# Patient Record
Sex: Female | Born: 1975
Health system: Southern US, Community
[De-identification: ages and names within clinical notes are randomized; demographics above are authoritative.]

## PROBLEM LIST (undated history)

## (undated) DIAGNOSIS — I2699 Other pulmonary embolism without acute cor pulmonale: Secondary | ICD-10-CM

## (undated) DIAGNOSIS — K219 Gastro-esophageal reflux disease without esophagitis: Secondary | ICD-10-CM

## (undated) HISTORY — PX: ABDOMINAL HYSTERECTOMY: SHX81

---

## 1998-04-17 ENCOUNTER — Inpatient Hospital Stay (HOSPITAL_COMMUNITY): Admission: AD | Admit: 1998-04-17 | Discharge: 1998-04-17 | Payer: Self-pay | Admitting: Obstetrics & Gynecology

## 1998-04-18 ENCOUNTER — Inpatient Hospital Stay (HOSPITAL_COMMUNITY): Admission: AD | Admit: 1998-04-18 | Discharge: 1998-04-18 | Payer: Self-pay | Admitting: Obstetrics & Gynecology

## 1998-04-20 ENCOUNTER — Inpatient Hospital Stay (HOSPITAL_COMMUNITY): Admission: AD | Admit: 1998-04-20 | Discharge: 1998-04-20 | Payer: Self-pay | Admitting: Obstetrics & Gynecology

## 1998-04-24 ENCOUNTER — Inpatient Hospital Stay (HOSPITAL_COMMUNITY): Admission: AD | Admit: 1998-04-24 | Discharge: 1998-04-24 | Payer: Self-pay | Admitting: *Deleted

## 1998-04-24 ENCOUNTER — Encounter: Payer: Self-pay | Admitting: *Deleted

## 1998-05-01 ENCOUNTER — Inpatient Hospital Stay (HOSPITAL_COMMUNITY): Admission: AD | Admit: 1998-05-01 | Discharge: 1998-05-05 | Payer: Self-pay | Admitting: Obstetrics and Gynecology

## 1998-05-05 ENCOUNTER — Encounter (HOSPITAL_COMMUNITY): Admission: RE | Admit: 1998-05-05 | Discharge: 1998-08-03 | Payer: Self-pay | Admitting: Obstetrics and Gynecology

## 1999-01-01 ENCOUNTER — Other Ambulatory Visit: Admission: RE | Admit: 1999-01-01 | Discharge: 1999-01-01 | Payer: Self-pay | Admitting: Obstetrics and Gynecology

## 2000-06-01 ENCOUNTER — Other Ambulatory Visit: Admission: RE | Admit: 2000-06-01 | Discharge: 2000-06-01 | Payer: Self-pay | Admitting: Obstetrics and Gynecology

## 2002-05-03 ENCOUNTER — Other Ambulatory Visit: Admission: RE | Admit: 2002-05-03 | Discharge: 2002-05-03 | Payer: Self-pay | Admitting: Obstetrics and Gynecology

## 2003-07-04 ENCOUNTER — Other Ambulatory Visit: Admission: RE | Admit: 2003-07-04 | Discharge: 2003-07-04 | Payer: Self-pay | Admitting: Obstetrics and Gynecology

## 2005-11-30 ENCOUNTER — Encounter: Admission: RE | Admit: 2005-11-30 | Discharge: 2005-11-30 | Payer: Self-pay | Admitting: Family Medicine

## 2007-06-18 ENCOUNTER — Inpatient Hospital Stay (HOSPITAL_COMMUNITY): Admission: RE | Admit: 2007-06-18 | Discharge: 2007-06-20 | Payer: Self-pay | Admitting: Obstetrics and Gynecology

## 2007-06-18 ENCOUNTER — Encounter (INDEPENDENT_AMBULATORY_CARE_PROVIDER_SITE_OTHER): Payer: Self-pay | Admitting: Obstetrics and Gynecology

## 2007-06-19 ENCOUNTER — Encounter (INDEPENDENT_AMBULATORY_CARE_PROVIDER_SITE_OTHER): Payer: Self-pay | Admitting: Obstetrics and Gynecology

## 2007-06-19 ENCOUNTER — Ambulatory Visit: Payer: Self-pay | Admitting: Surgery

## 2010-08-17 NOTE — Op Note (Signed)
[name], [name]              ACCOUNT NO.:  [id number]   MEDICAL RECORD NO.:  [id number]          PATIENT TYPE:  AMB   LOCATION:  SDC                           FACILITY:  [facility]   PHYSICIAN:  [name], M.D. DATE OF BIRTH:  18-Jan-1976   DATE OF PROCEDURE:  06/18/2007  DATE OF DISCHARGE:                               OPERATIVE REPORT   PREOPERATIVE DIAGNOSES:  1. Bilateral adnexal masses.  2. Menorrhagia.  3. Dysmenorrhea.   POSTOPERATIVE DIAGNOSES:  1. Menorrhagia.  2. Dysmenorrhea.  3. Pelvic adhesions.  4. Findings consistent with endometriosis.   PROCEDURE:  1. Laparoscopy.  2. Lysis of adhesions.  3. Exploratory laparotomy.  4. Total abdominal hysterectomy.  5. Cystoscopy.   SURGEON:  Dr. Normand Sloop   ASSISTANT:  Dr. Su Hilt   ANESTHESIA:  General.   FINDINGS:  A 12-week size uterus, normal adnexa bilaterally, adnexal  uterine adhesions, sigmoid colon sidewall adhesions, but the adnexa were  both normal bilaterally, also normal bladder with bilateral efflux from  ureters seen.   DISPOSITION:  Uterus was to pathology.   ESTIMATED BLOOD LOSS:  600 mL.   URINE OUTPUT:  1050 mL.   IV FLUIDS:  4000 mL crystalloid.   COMPLICATIONS:  None.  The patient to PACU in stable condition.   PROCEDURE IN DETAIL:  The patient was taken to the operating room where  she was given general anesthesia, placed in dorsal lithotomy position,  prepped and draped in a normal fashion.  A Foley catheter was placed; 5  mL of Marcaine was placed under the umbilicus along the skin.  The skin  was grasped with Allis's and entered with the knife and carried down to  the fascia.  The fascia was incised in the midline and extended  bilaterally.  The Roseanne Reno was placed into the umbilical incision into the  peritoneum.  Intraabdominal placement was confirmed with the  laparoscope.  The abdomen was insufflated with CO2 gas.  Two 5 mm  trocars were placed on the right and left lower  quadrant s.  Before the  trocars were introduced the rumi eas placed.  A weighted speculum was  placed in the vagina.  The anterior lip of the cervix was grasped with a  single-tooth tenaculum.  The uterus did sound to 12 cm.  The size 10  probe was placed on the RUMI and was placed without difficulty.  Once  all of the trocars were placed, the adnexal adhesions from the adnexa  were freed away from the uterus, both sharply and bluntly using the  Harmonic, and the utero-ovarian ligaments were cauterized and cut using  the Harmonic, but both round ligaments were cauterized and cut using the  Harmonic, and the bladder flap was created.  There were some dense  adhesions along the bladder which were also taken down sharply using  Metzenbaum scissors.  At this time, the cervical vaginal fascia was then  entered with the Harmonic until we saw the anterior portion of the co-  ring.  The patient's right uterine artery was skeletonized, clamped, and  cauterized with the Harmonic; hemostasis  was assured.  The patient's  left uterine artery was clamped, cauterized, and cut with the Harmonic,  but it bled.  I tried to stop the bleeding with continuous use of the  Harmonic and Kleppingers, but it continued to bleed.  Because of the  bleeding that was coming from the uterine artery, I decided to do an  exploratory laparotomy.  All trocars were removed except for the [facility].  A Pfannenstiel skin incision was made along her previous incision with  the scalpel and carried down to the fascia.  The fascia was incised in  the midline and extended bilaterally.Kochers x2 placed in the superior  aspect of the fascia and was dissected off the rectus muscle both  sharply and bluntly.  The inferior aspect of the fascia was dissected in  a similar fashion.  The muscles were separated in the midline; the  peritoneum was identified, tented up, and entered sharply and extended  superiorly inferiorly with good  visualization of bowel and bladder.  The  bowel was packed away with moist laparotomy sponges.  A Balfour  retractor was placed.  A figure-of-eight suture was placed at the fundus  to lift up the uterus.  I was then able to clamp the uterine artery that  had been bleeding.  We estimated the blood loss to be 300 mL at this  point.  At this point, both cardinal ligaments were clamped, cut, and  suture ligated.  Both uterosacral ligaments were clamped, cut, and  suture ligated in Heaney.  The uterine fundus was then removed and, at  this point, we just put 2 curved Heaneys underneath the cervix and was  then able to remove the fundus to get good visualization of the cervix  to remove it.  The vaginal cuff was then closed with 0 Vicryl and figure-  of-eight stitches.  There was some bleeding from the posterior aspect of  the peritoneum along the vaginal cuff.  This was made hemostatic using  figure-of-eight stitches of 3-0 Vicryl.  Irrigation was done.  Hemostasis was assured.  We looked at both ovarian pedicles.  There was  a little bit of bleeding from the left ovarian pedicle which was made  hemostatic with Bovie cautery and Gelfoam.  Gelfoam was also placed  along the vaginal cuff.  All instruments were then removed from the  abdomen.  The peritoneum was closed with 0 chromic.  Before the  peritoneum was closed, we did check to make sure that the [facility] suture  was on fascia, and it turned out that it was not, so we removed that,  and we grabbed the fascia and was able to close the fascia at the  umbilical site.  Then the peritoneum was closed with 0 chromic in a  running fashion.  The muscles were irrigated and noted to be hemostatic,  and the fascia was closed with 0 Vicryl, going from right to left to  meet in the midline.  The subcu tissue was reapproximated using 2-0  plain.  The skin was closed using 3-0 Monocryl at the umbilicus and at  the Pfannenstiel skin incision, but Dermabond  was placed over all  incisions.  I then took the Foley catheter out, placed Betadine, and did  cystoscopy.  Bladder was seen to be normal in integrity, and no sutures  were seen.  And also, both ureters were seen to efflux.  Sponge, lap,  and needle counts were correct.  The patient went to recovery in stable  condition.  Also, when we looked inside the abdomen, the findings were  as this:  Both adnexa had adhesions but were normal, so they were left.  She had several adhesions, though, bilateral adnexal uterine adhesions,  pelvic sidewall adhesions from the sigmoid to the left adnexa to the  side wall.  The patient had bladder uterine adhesions from her previous  C-section.  She also had what looked like endometriosis with some  induration and chocolate cyst  formation along the right uterosacral ligament.  This was removed when  the uterus was removed.  The patient also had some mild scarring along  the top of the liver.  Once all instruments were removed, sponge, lap,  and needle counts were correct, the patient went to the recovery room in  stable condition.      Nicole A. Normand Sloop, M.D.  Electronically Signed     NAD/MEDQ  D:  06/18/2007  T:  06/18/2007  Job:  [id number]

## 2010-08-17 NOTE — H&P (Signed)
[name], [name]              ACCOUNT NO.:  [id number]   MEDICAL RECORD NO.:  [id number]          PATIENT TYPE:  AMB   LOCATION:  SDC                           FACILITY:  [facility]   PHYSICIAN:  [name] A. Dillard, M.D. DATE OF BIRTH:  04-28-1975   DATE OF ADMISSION:  DATE OF DISCHARGE:                              HISTORY & PHYSICAL   CHIEF COMPLAINT:  Bilateral adnexal masses and menorrhagia.   The patient is a 35 year old African-American female, gravida 1, para 1,  who presented to me in September 2008 complaining of heavy periods and  abdominal pain.  She had a CT scan through her primary care physician  for abdominal pain which revealed a large left septated cystic mass  measuring 5.9 x 8 x 5.7 cm and a small right adnexal mass measuring 2.8  x 2.9 cm . There was no fluid or adenopathy in the pelvis, and the  uterus appeared normal.  The patient then followed up with an ultrasound  which showed uterus to measure 13.4 x 6.8 x 5.8 with an endometrial  stripe of 5 mm . The right ovary was normal.  The left ovary still had  adnexal mass measuring 5.6 x 4.7 x 3.9.  The patient was then referred  to me.  She stated that she wanted a left oophorectomy and right  cystectomy with hysterectomy.  The patient stated she has painful  periods which last about 6 days, and 3 days she changes her tampon about  every 1-1/2 hours.  Also sometimes her periods will come twice a week.  The patient denies any vaginal discharge or odor.  She did have fever .  She denies dyspareunia, dysuria, frequency, urgency, or hematuria.  No  history of kidney stones.  She occasionally has constipation.  Denies  any diarrhea, rectal bleeding.  She occasionally has nausea and vomiting  with her periods.  The patient is not pregnant. Has no history of  fibroids or endometriosis.  Her ultrasound is as above.  She is anemic  with a hemoglobin of 10.8 and is taking iron pills.   PAST MEDICAL HISTORY:  As above.   PAST  OB HISTORY:  Significant for cesarean section x1 . She stated she  no longer wants any childbearing.   PAST SURGICAL HISTORY:  Significant for cesarean section x1.   MEDICATIONS:  None.   ALLERGIES:  No known drug allergies.   FAMILY HISTORY:  Significant for hypertension in her father.  No  diabetes or GYN cancer.   SOCIAL HISTORY:  Negative for tobacco, alcohol, and drug use.   REVIEW OF SYSTEMS:  GENITOURINARY:  Significant for menorrhagia and  bilateral adnexal masses.  PSYCHIATRY:  The patient has a history of  depression.  MUSCULOSKELETAL:  No weakness.  ENDOCRINE:  No thyroid  abnormalities.   PHYSICAL EXAMINATION:  VITAL SIGNS:  Blood pressure 106/80.  Weight 267  pounds.  Pulse 84, temperature 98.2.  HEENT:  Pupils are equal.  Hearing is normal.  Throat is clear.  NECK:  Thyroid is not enlarged.  HEART:  Regular rate and rhythm.  LUNGS:  Clear to auscultation bilaterally.  BACK:  There is no CVA tenderness.  ABDOMEN:  Nontender without mass or organomegaly.  EXTREMITIES:  No cyanosis, clubbing, or edema.  NEUROLOGIC:  Exam is within normal limits.  PELVIC:  Vaginal exam is within normal limits.  Cervix is nontender.  Uterus is normal size, shape, consistency, nontender.  I am unable to  palpate any adnexal masses on exam.   ASSESSMENT:  1. Bilateral ovarian cysts.  2. Menorrhagia.  3. Dysmenorrhea.  4. Pelvic pain.   PLAN:  Possible TVH, left oophorectomy, right cystectomy.  The patient  understands there could be a possible total abdominal hysterectomy  depending on how large the mass is.  All treatments were reviewed with  the patient including observation, repeat ultrasound, cystectomy,  oophorectomy, D&C, hormonal treatment, IUD, ablation, and hysterectomy.  She no longer wanted childbearing and desired the above procedure, the  patient was told there is a small risk that the ovary could be  cancerous.  If it is, she would need a second surgery.  She was  fine  with this.  She declined any GYN consults.  She did sign our  hysterectomy consent.  She understands the risks are not limited to  bleeding, infection, damage to internal organs, and problems with  anesthesia, problems with positioning.   PERTINENT LABORATORY DATA:  Her initial biopsy in March 2009 was  proliferative endometrium, negative hyperplasia, atypia, or malignancy.  CA-125 was normal at 12.5.  Pap smear in July 2008 was normal.      [name] A. Normand Sloop, M.D.  Electronically Signed     NAD/MEDQ  D:  06/17/2007  T:  06/17/2007  Job:  [id number]

## 2010-08-17 NOTE — Consult Note (Signed)
NAMEMONIFA, [name]              ACCOUNT NO.:  [id number]   MEDICAL RECORD NO.:  [id number]          PATIENT TYPE:  INP   LOCATION:  9302                          FACILITY:  [facility]   PHYSICIAN:  Michael L. Reynolds, M.D.DATE OF BIRTH:  1975/09/19   DATE OF CONSULTATION:  06/19/2007  DATE OF DISCHARGE:                                 CONSULTATION   REQUESTING PHYSICIAN:  Dr. Normand Sloop.   REASON FOR EVALUATION:  Right arm numbness and weakness.   HISTORY OF PRESENT ILLNESS:  This is the initial inpatient consultation  evaluation of this 35 year old woman with little past medical history.  She was admitted to [facility] yesterday following a gynecological  procedure which included laparotomy, hysterectomy, and cystectomy.  She  states that, since she woke up, she has noticed a heavy sensation in  the right upper extremity, says the right arm feels numb.  Says this  really has not changed at all since the surgery yesterday.  Is not  really a painful sensation, she denies any problems with her neck.  She  does report a little bit of tingling sensation.  She noticed that her  grip is not as strong as it was.  She reported this to her surgeon this  morning and neurologic consultation has subsequently been requested.  The patient denies any involvement of the face, any changes in vision or  speech, any difficulties with the right leg including numbness,  weakness, paresthesias.  She is ambulating normally and, otherwise,  feels fine.  She denies any history of neck problems or any similar  symptoms in the past.   PAST MEDICAL HISTORY:  No significant chronic medical problems as noted  above.  She had a C-section in the past.   FAMILY/SOCIAL/REVIEW OF SYSTEMS:  Per admission H&P of June 17, 2007  which is reviewed.   MEDICATIONS:  None chronically.  In the hospital, she is receiving  intravenous fluid and several p.r.n.'s  including Dilaudid.   PHYSICAL EXAMINATION:  VITAL SIGNS:   Temperature 98.2, blood pressure  105/65, pulse 110, respirations 20, O2 sat 98% on room air.  GENERAL EXAMINATION:  This is a healthy-appearing woman seen with no  evidence distress.  HEAD:  Cranium is normocephalic and atraumatic.  Oropharynx benign.  NECK:  Is supple without carotid or supraclavicular bruits.  HEART:  Regular rate and rhythm without murmurs.   NEUROLOGICAL EXAMINATION:  MENTAL STATUS:  She is awake and alert.  Speech is fluent and not dysarthric.  Mood is euthymic and affect  appropriate.  CRANIAL NERVES:  Pupils are equal and reactive.  Extraocular movements  are full without nystagmus.  Visual fields full to confrontation.  Hearing is intact to conversational speech.  Facial sensation intact to  pinprick.  Face, tongue and palate move normally and symmetrically.  MOTOR:  Normal bulk and tone.  In the right extremity, she has distinct  weakness of the biceps which is about 4-/5.  Other than that, she has  some motor impersistence in the right upper extremity, but actually is  able to generate full strength in all other tested  extremity muscles.  Strength is, otherwise, normal elsewhere.  Tone is normal.  SENSORY:  She reports diminished pinprick sensation over the lateral  forearm and the hand in the thumb and forefinger, to a lesser extent,  the middle finger.  Otherwise, normal pinprick and light touch sensation  throughout.  COORDINATION:  Rapid movements are performed slowly on the right and  adequately on the left.  Finger-to-nose is performed accurately,  although there is a bit of weakness and tremulousness on the right.  Gait is deferred.  REFLEXES:  No biceps or brachioradialis jerk on the right.  There is a  slight triceps jerk.  Reflexes in the left upper extremity are 1+.  Knee  jerks are 1+.  Toes are downgoing bilaterally.   LABORATORY REVIEW:  BMET for this morning remarkable for an elevated  glucose of 130, otherwise normal.  CBC remarkable for a  low hemoglobin  8.2, normal white count and platelets.  She has had no neuro imaging.   IMPRESSION:  Right extremity biceps weakness and sensory changes.  The  findings  suggest a peripheral process.  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  She also had a blood  pressure cuff on the right arm and that may have played a role.  Suspect  that localization is in the plexus, perhaps the lateral cord.  Right now  she is not having significant pain.   RECOMMENDATIONS:  Would suggest only conservative management and  observation at this time.  If the arm becomes painful, gabapentin 300 mg  up to t.i.d. can be used as needed.  Would anticipate that this will get  better in a couple of weeks but, if it does not, she should be seen in  our office for an outpatient EMG and nerve conduction study.  Hopefully,  this will resolve quickly.  Thank you for the consultation.      Michael L. Thad Ranger, M.D.  Electronically Signed     MLR/MEDQ  D:  06/19/2007  T:  06/19/2007  Job:  [id number]

## 2010-08-20 NOTE — Discharge Summary (Signed)
[name], [name]              ACCOUNT NO.:  [id number]   MEDICAL RECORD NO.:  [id number]          PATIENT TYPE:  INP   LOCATION:  9302                          FACILITY:  [facility]   PHYSICIAN:  [name] A. Dillard, M.D. DATE OF BIRTH:  Mar 27, 1976   DATE OF ADMISSION:  06/18/2007  DATE OF DISCHARGE:  06/20/2007                               DISCHARGE SUMMARY   DISCHARGE DIAGNOSES:  1. Menorrhagia.  2. Dysmenorrhea.  3. Bilateral adnexal masses.   OPERATION:  On the date of admission, the patient underwent laparoscopic  lysis of adhesions, exploratory laparotomy with a total abdominal  hysterectomy and cystoscopy, tolerating all procedures well.  The  patient was found to have a 12-week sized uterus with normal adnexa  bilaterally.  There were noted adnexal uterine adhesions, sigmoid colon  sidewall adhesions; however, both adnexa appeared normal, as was  bladder, and both ureters on cystoscopy were patent.   HISTORY OF PRESENT ILLNESS:  [name] is a 35 year old African-  American female para 1-0-0-1, who presented for hysterectomy because of  heavy menstrual periods and abdominal pain.  Please see the patient's  dictated history and physical examination for details.   PREOPERATIVE PHYSICAL EXAM:  VITAL SIGNS:  Blood pressure 106/80, weight  267 pounds, pulse 84, and temperature 98.2 degrees Fahrenheit orally.  GENERAL:  Within normal limits.  PELVIC:  Vagina is normal.  Cervix is nontender.  Uterus is normal size,  shape, and consistency.  Adnexa appeared normal without any palpable  evidence of adnexal masses.   HOSPITAL COURSE:  On the date of admission, the patient underwent  aforementioned procedures tolerating them all well.  The patient had a  postop hemoglobin of 9.6, which later dropped to 8.2; however, the  patient remained asymptomatic.  The patient's postoperative course was  marked by the patient complaining of pain in her left lower extremity;  however, venous  Doppler study did not reveal any deep pain nor  superficial vein thromboses or Baker's cyst.  Additionally, the patient  reported numbness in her right upper extremity and received a neurologic  consultation on postop day #1, at which time, it was deemed that she had  a peripheral nerve compression, which was expected to resolve over time.  By postop day #2, the patient had demonstrated improvement in her right  upper extremity numbness, no longer complained of left lower extremity  pain, had resumed bowel and bladder function, and was therefore deemed  ready for discharge home.   DISCHARGE MEDICATIONS:  1. Percocet 5/325 one to two tablets every 4 hours as needed for pain.  2. Ibuprofen 600 mg with food every 6 hours for 5 days, then as needed      for pain.  3. Repliva 1 tablet daily for 21 days, stop at 7 days, then resume      cycle x2.  4. Colace 100 mg twice daily until bowel movements are regular.  5. Gabapentin 300 mg 1 tablet 3 times a day as needed for right upper      extremity pain.   FOLLOW UP:  The patient is to  follow up with Dr. Normand Sloop on July 03, 2007 at 9:30 a.m.  She also is to schedule a 6-week postoperative visit  with her by calling [phone].   DISCHARGE INSTRUCTIONS:  The patient was given a copy of [facility] postoperative instruction sheet.  She was further  advised to avoid driving for 2 weeks, heavy lifting for 4 weeks,  intercourse for 6 weeks, but she may shower, and she may walk up steps.  The patient's diet was without restriction.   FINAL PATHOLOGY:  Uterus and cervix hysterectomy:  Benign secretory  endometrium, no hyperplasia or malignancy, chronic cervicitis with  squamous metaplasia, and no dysplasia or malignancy.      [name].      [name] A. Normand Sloop, M.D.  Electronically Signed    EJP/MEDQ  D:  07/09/2007  T:  07/09/2007  Job:  [id number]

## 2010-12-27 LAB — BASIC METABOLIC PANEL
BUN: 7
BUN: 3 — ABNORMAL LOW
CO2: 27
Calcium: 8.8
Calcium: 8.5
Chloride: 101
Chloride: 109
Creatinine, Ser: 0.89
Creatinine, Ser: 0.92
GFR calc Af Amer: >60
GFR calc non Af Amer: >60
Glucose, Bld: 112 — ABNORMAL HIGH
Potassium: 3.7
Sodium: 134 — ABNORMAL LOW

## 2010-12-27 LAB — CBC
HCT: 32.7 — ABNORMAL LOW
Hemoglobin: 10.9 — ABNORMAL LOW
MCHC: 33.2
MCV: 74.3 — ABNORMAL LOW
MCV: 75.4 — ABNORMAL LOW
Platelets: 268
Platelets: 189
RBC: 4.39
RDW: 16.2 — ABNORMAL HIGH
WBC: 7.3
WBC: 10.4

## 2010-12-27 LAB — URINE CULTURE: Culture: NO GROWTH

## 2010-12-27 LAB — HCG, QUANTITATIVE, PREGNANCY: hCG, Beta Chain, Quant, S: <2

## 2010-12-27 LAB — HEMOGLOBIN AND HEMATOCRIT, BLOOD
HCT: 30.3 — ABNORMAL LOW
Hemoglobin: 9.6 — ABNORMAL LOW

## 2011-06-03 DIAGNOSIS — E785 Hyperlipidemia, unspecified: Secondary | ICD-10-CM | POA: Insufficient documentation

## 2014-02-07 DIAGNOSIS — M549 Dorsalgia, unspecified: Secondary | ICD-10-CM | POA: Insufficient documentation

## 2014-03-03 ENCOUNTER — Other Ambulatory Visit: Payer: Self-pay

## 2014-03-03 DIAGNOSIS — Z1231 Encounter for screening mammogram for malignant neoplasm of breast: Secondary | ICD-10-CM

## 2014-03-04 DIAGNOSIS — E559 Vitamin D deficiency, unspecified: Secondary | ICD-10-CM | POA: Insufficient documentation

## 2014-03-17 ENCOUNTER — Ambulatory Visit
Admission: RE | Admit: 2014-03-17 | Discharge: 2014-03-17 | Disposition: A | Discharge status: Home / Self Care | Payer: 59 | Source: Ambulatory Visit

## 2014-03-17 DIAGNOSIS — Z1231 Encounter for screening mammogram for malignant neoplasm of breast: Secondary | ICD-10-CM

## 2014-11-11 DIAGNOSIS — L669 Cicatricial alopecia, unspecified: Secondary | ICD-10-CM | POA: Insufficient documentation

## 2014-12-24 DIAGNOSIS — Z5189 Encounter for other specified aftercare: Secondary | ICD-10-CM | POA: Insufficient documentation

## 2015-02-22 ENCOUNTER — Emergency Department (HOSPITAL_BASED_OUTPATIENT_CLINIC_OR_DEPARTMENT_OTHER): Payer: 59

## 2015-02-22 ENCOUNTER — Emergency Department (HOSPITAL_BASED_OUTPATIENT_CLINIC_OR_DEPARTMENT_OTHER)
Admission: EM | Admit: 2015-02-22 | Discharge: 2015-02-22 | Disposition: A | Discharge status: Home / Self Care | Payer: 59 | Attending: Emergency Medicine | Admitting: Emergency Medicine

## 2015-02-22 ENCOUNTER — Encounter (HOSPITAL_BASED_OUTPATIENT_CLINIC_OR_DEPARTMENT_OTHER): Payer: Self-pay | Admitting: Emergency Medicine

## 2015-02-22 DIAGNOSIS — R079 Chest pain, unspecified: Secondary | ICD-10-CM | POA: Insufficient documentation

## 2015-02-22 DIAGNOSIS — K219 Gastro-esophageal reflux disease without esophagitis: Secondary | ICD-10-CM | POA: Insufficient documentation

## 2015-02-22 DIAGNOSIS — R12 Heartburn: Secondary | ICD-10-CM | POA: Diagnosis present

## 2015-02-22 MED ORDER — GLUCAGON HCL RDNA (DIAGNOSTIC) 1 MG IJ SOLR
1.0000 mg | Freq: Once | INTRAMUSCULAR | Status: AC
  Administered 2015-02-22: 1 mg via INTRAMUSCULAR
  Filled 2015-02-22: qty 1

## 2015-02-22 MED ORDER — OMEPRAZOLE 20 MG PO CPDR: 20.0000 mg | DELAYED_RELEASE_CAPSULE | Freq: Every day | ORAL | Status: AC

## 2015-02-22 MED ORDER — GI COCKTAIL ~~LOC~~
30.0000 mL | Freq: Once | ORAL | Status: AC
  Administered 2015-02-22: 30 mL via ORAL
  Filled 2015-02-22: qty 30

## 2015-02-22 NOTE — Discharge Instructions (Signed)
Call [facility] to schedule a follow up appointment. Take the omeprazole daily.   Gastroesophageal Reflux Disease, Adult Normally, food travels down the esophagus and stays in the stomach to be digested. However, when a person has gastroesophageal reflux disease (GERD), food and stomach acid move back up into the esophagus. When this happens, the esophagus becomes sore and inflamed. Over time, GERD can create small holes (ulcers) in the lining of the esophagus.  CAUSES This condition is caused by a problem with the muscle between the esophagus and the stomach (lower esophageal sphincter, or LES). Normally, the LES muscle closes after food passes through the esophagus to the stomach. When the LES is weakened or abnormal, it does not close properly, and that allows food and stomach acid to go back up into the esophagus. The LES can be weakened by certain dietary substances, medicines, and medical conditions, including:  Tobacco use.  Pregnancy.  Having a hiatal hernia.  Heavy alcohol use.  Certain foods and beverages, such as coffee, chocolate, onions, and peppermint. RISK FACTORS This condition is more likely to develop in:  People who have an increased body weight.  People who have connective tissue disorders.  People who use NSAID medicines. SYMPTOMS Symptoms of this condition include:  Heartburn.  Difficult or painful swallowing.  The feeling of having a lump in the throat.  Abitter taste in the mouth.  Bad breath.  Having a large amount of saliva.  Having an upset or bloated stomach.  Belching.  Chest pain.  Shortness of breath or wheezing.  Ongoing (chronic) cough or a night-time cough.  Wearing away of tooth enamel.  Weight loss. Different conditions can cause chest pain. Make sure to see your health care provider if you experience chest pain. DIAGNOSIS Your health care provider will take a medical history and perform a physical exam. To  determine if you have mild or severe GERD, your health care provider may also monitor how you respond to treatment. You may also have other tests, including:  An endoscopy toexamine your stomach and esophagus with a small camera.  A test thatmeasures the acidity level in your esophagus.  A test thatmeasures how much pressure is on your esophagus.  A barium swallow or modified barium swallow to show the shape, size, and functioning of your esophagus. TREATMENT The goal of treatment is to help relieve your symptoms and to prevent complications. Treatment for this condition may vary depending on how severe your symptoms are. Your health care provider may recommend:  Changes to your diet.  Medicine.  Surgery. HOME CARE INSTRUCTIONS Diet  Follow a diet as recommended by your health care provider. This may involve avoiding foods and drinks such as:  Coffee and tea (with or without caffeine).  Drinks that containalcohol.  Energy drinks and sports drinks.  Carbonated drinks or sodas.  Chocolate and cocoa.  Peppermint and mint flavorings.  Garlic and onions.  Horseradish.  Spicy and acidic foods, including peppers, chili powder, curry powder, vinegar, hot sauces, and barbecue sauce.  Citrus fruit juices and citrus fruits, such as oranges, lemons, and limes.  Tomato-based foods, such as red sauce, chili, salsa, and pizza with red sauce.  Fried and fatty foods, such as donuts, french fries, potato chips, and high-fat dressings.  High-fat meats, such as hot dogs and fatty cuts of red and white meats, such as rib eye steak, sausage, ham, and bacon.  High-fat dairy items, such as whole milk, butter, and cream cheese.  Eat small,  frequent meals instead of large meals.  Avoid drinking large amounts of liquid with your meals.  Avoid eating meals during the 2-3 hours before bedtime.  Avoid lying down right after you eat.  Do not exercise right after you eat. General  Instructions  Pay attention to any changes in your symptoms.  Take over-the-counter and prescription medicines only as told by your health care provider. Do not take aspirin, ibuprofen, or other NSAIDs unless your health care provider told you to do so.  Do not use any tobacco products, including cigarettes, chewing tobacco, and e-cigarettes. If you need help quitting, ask your health care provider.  Wear loose-fitting clothing. Do not wear anything tight around your waist that causes pressure on your abdomen.  Raise (elevate) the head of your bed 6 inches (15cm).  Try to reduce your stress, such as with yoga or meditation. If you need help reducing stress, ask your health care provider.  If you are overweight, reduce your weight to an amount that is healthy for you. Ask your health care provider for guidance about a safe weight loss goal.  Keep all follow-up visits as told by your health care provider. This is important. SEEK MEDICAL CARE IF:  You have new symptoms.  You have unexplained weight loss.  You have difficulty swallowing, or it hurts to swallow.  You have wheezing or a persistent cough.  Your symptoms do not improve with treatment.  You have a hoarse voice. SEEK IMMEDIATE MEDICAL CARE IF:  You have pain in your arms, neck, jaw, teeth, or back.  You feel sweaty, dizzy, or light-headed.  You have chest pain or shortness of breath.  You vomit and your vomit looks like blood or coffee grounds.  You faint.  Your stool is bloody or black.  You cannot swallow, drink, or eat.   This information is not intended to replace advice given to you by your health care provider. Make sure you discuss any questions you have with your health care provider.   Document Released: 12/29/2004 Document Revised: 12/10/2014 Document Reviewed: 07/16/2014 Elsevier Interactive Patient Education [employer]

## 2015-02-22 NOTE — ED Notes (Signed)
Pt does report eating apple cider vinegar to help with indegestion, no there medicaitons were consumed

## 2015-02-22 NOTE — ED Provider Notes (Signed)
CSN: [id number]     Arrival date & time 02/22/15  1228 History   First MD Initiated Contact with Patient 02/22/15 1406     Chief Complaint  Patient presents with  . Heartburn   HPI  [name] is a 39 year old female with PMHx of reflux presenting with heartburn and sensation of "something stuck". She states that she has had GERD for almost a year now. She states that she typically controls her symptoms with over-the-counter acid relievers but she does not take these on a daily basis. She reports eating a large steak dinner approximately a week ago. She states she had this sensation of something stuck behind her sternum after this dinner. The feeling has gradually gotten better but is still there. She also reports increased heartburn over the past 3 weeks. She states that this is typical of her acid reflux symptoms. She has tried over-the-counter Prevacid but this has not helped. Her symptoms include sternal burning, increased belching and indigestion. She has no other complaints at this time. Denies fevers, chills, diaphoresis, headache, dizziness, shortness of breath, cough, abdominal pain, nausea or vomiting. She does not follow with a gastroenterologist.  History reviewed. No pertinent past medical history. History reviewed. No pertinent past surgical history. History reviewed. No pertinent family history. Social History  Substance Use Topics  . Smoking status: Never Smoker   . Smokeless tobacco: None  . Alcohol Use: No   OB History    No data available     Review of Systems  Constitutional: Negative for fever, chills and diaphoresis.  HENT: Negative for sore throat.   Respiratory: Negative for shortness of breath.   Cardiovascular: Positive for chest pain (burning).  Gastrointestinal: Negative for nausea, vomiting, abdominal pain and diarrhea.  Neurological: Negative for dizziness, syncope and headaches.  All other systems reviewed and are negative.     Allergies  Review of  patient's allergies indicates no known allergies.  Home Medications   Prior to Admission medications   Medication Sig Start Date End Date Taking? Authorizing Provider  omeprazole (PRILOSEC) 20 MG capsule Take 1 capsule (20 mg total) by mouth daily. 02/22/15   Myliah Medel, PA-C   BP 134/76 mmHg  Pulse 92  Temp(Src) 98 F (36.7 C) (Oral)  Resp 18  SpO2 100% Physical Exam  Constitutional: She appears well-developed and well-nourished. No distress.  HENT:  Head: Normocephalic and atraumatic.  Mouth/Throat: Oropharynx is clear and moist. No oropharyngeal exudate.  Eyes: Conjunctivae are normal. Right eye exhibits no discharge. Left eye exhibits no discharge. No scleral icterus.  Neck: Normal range of motion. Neck supple.  Cardiovascular: Normal rate, regular rhythm and normal heart sounds.   Pulmonary/Chest: Effort normal and breath sounds normal. No respiratory distress. She has no wheezes. She has no rales. She exhibits no tenderness.  Musculoskeletal: Normal range of motion.  Moves extremities spontaneously and walks with a steady gait  Lymphadenopathy:    She has no cervical adenopathy.  Neurological: She is alert. Coordination normal.  Skin: Skin is warm and dry.  Psychiatric: She has a normal mood and affect. Her behavior is normal.  Nursing note and vitals reviewed.   ED Course  Procedures (including critical care time) Labs Review Labs Reviewed - No data to display  Imaging Review Dg Chest 2 View  02/22/2015  CLINICAL DATA:  Midsternal chest pain and epigastric pain. EXAM: CHEST  2 VIEW COMPARISON:  None. FINDINGS: Cardiomediastinal silhouette is normal. Mediastinal contours appear intact. There is no  evidence of focal airspace consolidation, pleural effusion or pneumothorax. Osseous structures are without acute abnormality. Soft tissues are grossly normal. IMPRESSION: No active cardiopulmonary disease. Electronically Signed   By: Ted Mcalpineobrinka  Dimitrova M.D.   On:  02/22/2015 14:26   I have personally reviewed and evaluated these images and lab results as part of my medical decision-making.   EKG Interpretation   Date/Time:  Sunday February 22 2015 12:38:32 EST Ventricular Rate:  109 PR Interval:  146 QRS Duration: 68 QT Interval:  338 QTC Calculation: 455 R Axis:   62 Text Interpretation:  Sinus tachycardia Possible Left atrial enlargement  Borderline ECG No previous ECGs available Confirmed by ZACKOWSKI  MD,  SCOTT [phone]([id number]) on 02/22/2015 12:41:41 PM      MDM   Final diagnoses:  Gastroesophageal reflux disease, esophagitis presence not specified   Patient presenting with increased severity of acid reflux and sensation of something stuck in her chest. She states her acid reflux symptoms are typical of her presentation though they are not controlled with Prevacid. She states that she has sensation of food stuck behind her sternum yes take 1 week ago. She has no other complaints at this time. Vital signs stable. She was tachycardic in triage to 121 but during interview and exam heart rate remained in the 90s. Benign, nonfocal exam. She reports full symptom resolution with GI cocktail and glucagon. We'll give GI follow-up information and month-long omeprazole prescription.  At this time there does not appear to be any evidence of an acute emergency medical condition and the patient appears stable for discharge with appropriate outpatient follow up. Diagnosis was discussed with patient who verbalizes understanding and is agreeable to discharge. Return precautions given in discharge paperwork and discussed with pt at bedside. Pt is stable for discharge.   Rolm GalaStevi Shylin Keizer, PA-C 02/22/15 1847  Vanetta MuldersScott Zackowski, MD 02/23/15 1241

## 2015-02-22 NOTE — ED Notes (Signed)
Patient states that she is feeling much better and is ready to go home.

## 2015-02-22 NOTE — ED Notes (Signed)
Pt reports heartburn x 3 weeks, pain worse the last 3 days, today she felt sob, pt in nad during triage, talking without diffulty

## 2015-02-24 ENCOUNTER — Other Ambulatory Visit: Payer: Self-pay

## 2015-02-24 DIAGNOSIS — Z1231 Encounter for screening mammogram for malignant neoplasm of breast: Secondary | ICD-10-CM

## 2015-02-25 DIAGNOSIS — F329 Major depressive disorder, single episode, unspecified: Secondary | ICD-10-CM | POA: Insufficient documentation

## 2015-02-25 DIAGNOSIS — F411 Generalized anxiety disorder: Secondary | ICD-10-CM | POA: Diagnosis present

## 2015-03-18 DIAGNOSIS — R0789 Other chest pain: Secondary | ICD-10-CM | POA: Insufficient documentation

## 2015-03-19 ENCOUNTER — Ambulatory Visit
Admission: RE | Admit: 2015-03-19 | Discharge: 2015-03-19 | Disposition: A | Discharge status: Home / Self Care | Payer: 59 | Source: Ambulatory Visit

## 2015-03-19 DIAGNOSIS — Z1231 Encounter for screening mammogram for malignant neoplasm of breast: Secondary | ICD-10-CM

## 2015-03-24 ENCOUNTER — Ambulatory Visit: Payer: Self-pay

## 2015-06-21 ENCOUNTER — Encounter (HOSPITAL_BASED_OUTPATIENT_CLINIC_OR_DEPARTMENT_OTHER): Payer: Self-pay | Admitting: *Deleted

## 2015-06-21 ENCOUNTER — Emergency Department (HOSPITAL_BASED_OUTPATIENT_CLINIC_OR_DEPARTMENT_OTHER): Payer: 59

## 2015-06-21 DIAGNOSIS — Z90710 Acquired absence of both cervix and uterus: Secondary | ICD-10-CM

## 2015-06-21 DIAGNOSIS — K565 Intestinal adhesions [bands] with obstruction (postprocedural) (postinfection): Secondary | ICD-10-CM | POA: Diagnosis not present

## 2015-06-21 DIAGNOSIS — F411 Generalized anxiety disorder: Secondary | ICD-10-CM | POA: Diagnosis present

## 2015-06-21 DIAGNOSIS — R000 Tachycardia, unspecified: Secondary | ICD-10-CM | POA: Diagnosis present

## 2015-06-21 DIAGNOSIS — K567 Ileus, unspecified: Secondary | ICD-10-CM | POA: Diagnosis not present

## 2015-06-21 DIAGNOSIS — E669 Obesity, unspecified: Secondary | ICD-10-CM | POA: Diagnosis present

## 2015-06-21 DIAGNOSIS — R1033 Periumbilical pain: Secondary | ICD-10-CM | POA: Diagnosis not present

## 2015-06-21 DIAGNOSIS — K219 Gastro-esophageal reflux disease without esophagitis: Secondary | ICD-10-CM | POA: Diagnosis present

## 2015-06-21 DIAGNOSIS — Z6839 Body mass index (BMI) 39.0-39.9, adult: Secondary | ICD-10-CM

## 2015-06-21 LAB — URINE MICROSCOPIC-ADD ON

## 2015-06-21 LAB — URINALYSIS, ROUTINE W REFLEX MICROSCOPIC
GLUCOSE, UA: NEGATIVE mg/dL
KETONES UR: 15 mg/dL — AB
Nitrite: NEGATIVE
PH: 6 (ref 5.0–8.0)
Protein, ur: 100 mg/dL — AB
Specific Gravity, Urine: 1.027 (ref 1.005–1.030)

## 2015-06-21 NOTE — ED Notes (Addendum)
C/o abd pain, nv, last emesis yesterday. seen for the same last monday, dx'd with constipation, unable to tolerate miralax or the nausea med.  Seen again on Wednesday and given amitiza and zofran. Has taken MOM also. Last BM today, still liquid, has not had any solid foods. Here for remaining persistant abd pain. (denies: fever)

## 2015-06-22 ENCOUNTER — Inpatient Hospital Stay (HOSPITAL_COMMUNITY): Payer: 59

## 2015-06-22 ENCOUNTER — Emergency Department (HOSPITAL_BASED_OUTPATIENT_CLINIC_OR_DEPARTMENT_OTHER): Payer: 59

## 2015-06-22 ENCOUNTER — Encounter (HOSPITAL_BASED_OUTPATIENT_CLINIC_OR_DEPARTMENT_OTHER): Payer: Self-pay | Admitting: Emergency Medicine

## 2015-06-22 ENCOUNTER — Inpatient Hospital Stay (HOSPITAL_BASED_OUTPATIENT_CLINIC_OR_DEPARTMENT_OTHER)
Admission: EM | Admit: 2015-06-22 | Discharge: 2015-07-03 | DRG: 337 | Disposition: A | Discharge status: Home / Self Care | Payer: 59 | Attending: Surgery | Admitting: Surgery

## 2015-06-22 DIAGNOSIS — F411 Generalized anxiety disorder: Secondary | ICD-10-CM | POA: Diagnosis present

## 2015-06-22 DIAGNOSIS — R000 Tachycardia, unspecified: Secondary | ICD-10-CM | POA: Diagnosis present

## 2015-06-22 DIAGNOSIS — E669 Obesity, unspecified: Secondary | ICD-10-CM | POA: Diagnosis not present

## 2015-06-22 DIAGNOSIS — Z6839 Body mass index (BMI) 39.0-39.9, adult: Secondary | ICD-10-CM | POA: Diagnosis not present

## 2015-06-22 DIAGNOSIS — K56609 Unspecified intestinal obstruction, unspecified as to partial versus complete obstruction: Secondary | ICD-10-CM | POA: Diagnosis present

## 2015-06-22 DIAGNOSIS — K219 Gastro-esophageal reflux disease without esophagitis: Secondary | ICD-10-CM | POA: Diagnosis not present

## 2015-06-22 DIAGNOSIS — Z90710 Acquired absence of both cervix and uterus: Secondary | ICD-10-CM | POA: Diagnosis not present

## 2015-06-22 DIAGNOSIS — Z0189 Encounter for other specified special examinations: Secondary | ICD-10-CM

## 2015-06-22 DIAGNOSIS — K565 Intestinal adhesions [bands] with obstruction (postprocedural) (postinfection): Secondary | ICD-10-CM | POA: Diagnosis not present

## 2015-06-22 DIAGNOSIS — K567 Ileus, unspecified: Secondary | ICD-10-CM | POA: Diagnosis not present

## 2015-06-22 DIAGNOSIS — R1033 Periumbilical pain: Secondary | ICD-10-CM | POA: Diagnosis present

## 2015-06-22 HISTORY — DX: Gastro-esophageal reflux disease without esophagitis: K21.9

## 2015-06-22 LAB — CBC WITH DIFFERENTIAL/PLATELET
BASOS ABS: 0 10*3/uL (ref 0.0–0.1)
BLASTS: 0 %
Band Neutrophils: 12 %
Basophils Relative: 0 %
Eosinophils Absolute: 0 10*3/uL (ref 0.0–0.7)
Eosinophils Relative: 0 %
HEMATOCRIT: 42.8 % (ref 36.0–46.0)
Hemoglobin: 14 g/dL (ref 12.0–15.0)
Lymphocytes Relative: 23 %
Lymphs Abs: 1.8 10*3/uL (ref 0.7–4.0)
MCH: 26.6 pg (ref 26.0–34.0)
MCHC: 32.7 g/dL (ref 30.0–36.0)
MCV: 81.2 fL (ref 78.0–100.0)
METAMYELOCYTES PCT: 0 %
MYELOCYTES: 0 %
Monocytes Absolute: 1.6 10*3/uL — ABNORMAL HIGH (ref 0.1–1.0)
Monocytes Relative: 20 %
NEUTROS ABS: 4.4 10*3/uL (ref 1.7–7.7)
NRBC: 0 /100{WBCs}
Neutrophils Relative %: 45 %
Other: 0 %
Platelets: 282 10*3/uL (ref 150–400)
Promyelocytes Absolute: 0 %
RBC: 5.27 MIL/uL — AB (ref 3.87–5.11)
RDW: 13.3 % (ref 11.5–15.5)
WBC: 7.8 10*3/uL (ref 4.0–10.5)

## 2015-06-22 LAB — BASIC METABOLIC PANEL
ANION GAP: 10 (ref 5–15)
BUN: 16 mg/dL (ref 6–20)
CO2: 29 mmol/L (ref 22–32)
Calcium: 9 mg/dL (ref 8.9–10.3)
Chloride: 95 mmol/L — ABNORMAL LOW (ref 101–111)
Creatinine, Ser: 1.1 mg/dL — ABNORMAL HIGH (ref 0.44–1.00)
GFR calc Af Amer: >60 mL/min (ref >60)
GFR calc non Af Amer: >60 mL/min (ref >60)
GLUCOSE: 109 mg/dL — AB (ref 65–99)
POTASSIUM: 3.2 mmol/L — AB (ref 3.5–5.1)
Sodium: 134 mmol/L — ABNORMAL LOW (ref 135–145)

## 2015-06-22 MED ORDER — IOHEXOL 300 MG/ML  SOLN
25.0000 mL | Freq: Once | INTRAMUSCULAR | Status: AC | PRN
  Administered 2015-06-22: 25 mL via ORAL

## 2015-06-22 MED ORDER — KETOROLAC TROMETHAMINE 30 MG/ML IJ SOLN
30.0000 mg | Freq: Once | INTRAMUSCULAR | Status: AC
  Administered 2015-06-22: 30 mg via INTRAVENOUS
  Filled 2015-06-22: qty 1

## 2015-06-22 MED ORDER — CHLORHEXIDINE GLUCONATE 0.12 % MT SOLN
15.0000 mL | Freq: Two times a day (BID) | OROMUCOSAL | Status: DC
Start: 2015-06-22 — End: 2015-06-26
  Administered 2015-06-22 – 2015-06-25 (×7): 15 mL via OROMUCOSAL
  Filled 2015-06-22 (×10): qty 15

## 2015-06-22 MED ORDER — ONDANSETRON 4 MG PO TBDP: 4.0000 mg | ORAL_TABLET | Freq: Four times a day (QID) | ORAL | Status: DC | PRN

## 2015-06-22 MED ORDER — SODIUM CHLORIDE 0.9 % IV BOLUS (SEPSIS)
1000.0000 mL | Freq: Once | INTRAVENOUS | Status: AC
  Administered 2015-06-22: 1000 mL via INTRAVENOUS

## 2015-06-22 MED ORDER — FENTANYL CITRATE (PF) 100 MCG/2ML IJ SOLN
100.0000 ug | Freq: Once | INTRAMUSCULAR | Status: AC
  Administered 2015-06-22: 100 ug via INTRAVENOUS
  Filled 2015-06-22: qty 2

## 2015-06-22 MED ORDER — ENOXAPARIN SODIUM 40 MG/0.4ML ~~LOC~~ SOLN
40.0000 mg | SUBCUTANEOUS | Status: DC
  Administered 2015-06-22 – 2015-06-26 (×5): 40 mg via SUBCUTANEOUS
  Filled 2015-06-22 (×5): qty 0.4

## 2015-06-22 MED ORDER — KCL IN DEXTROSE-NACL 20-5-0.9 MEQ/L-%-% IV SOLN
INTRAVENOUS | Status: DC
  Administered 2015-06-22 – 2015-06-23 (×3): 1000 mL via INTRAVENOUS
  Administered 2015-06-23: 13:00:00 via INTRAVENOUS
  Filled 2015-06-22 (×5): qty 1000

## 2015-06-22 MED ORDER — ONDANSETRON HCL 4 MG/2ML IJ SOLN
4.0000 mg | Freq: Once | INTRAMUSCULAR | Status: AC
  Administered 2015-06-22: 4 mg via INTRAVENOUS
  Filled 2015-06-22: qty 2

## 2015-06-22 MED ORDER — MORPHINE SULFATE (PF) 2 MG/ML IV SOLN
2.0000 mg | INTRAVENOUS | Status: DC | PRN
  Administered 2015-06-22 – 2015-06-25 (×9): 4 mg via INTRAVENOUS
  Administered 2015-06-25: 2 mg via INTRAVENOUS
  Administered 2015-06-25 – 2015-06-26 (×5): 4 mg via INTRAVENOUS
  Filled 2015-06-22 (×15): qty 2
  Filled 2015-06-22: qty 1

## 2015-06-22 MED ORDER — IOHEXOL 300 MG/ML  SOLN
100.0000 mL | Freq: Once | INTRAMUSCULAR | Status: AC | PRN
  Administered 2015-06-22: 100 mL via INTRAVENOUS

## 2015-06-22 MED ORDER — DIATRIZOATE MEGLUMINE & SODIUM 66-10 % PO SOLN
90.0000 mL | Freq: Once | ORAL | Status: AC
  Administered 2015-06-22: 90 mL via NASOGASTRIC
  Filled 2015-06-22: qty 90

## 2015-06-22 MED ORDER — CETYLPYRIDINIUM CHLORIDE 0.05 % MT LIQD
7.0000 mL | Freq: Two times a day (BID) | OROMUCOSAL | Status: DC
  Administered 2015-06-22 – 2015-06-25 (×6): 7 mL via OROMUCOSAL

## 2015-06-22 MED ORDER — MORPHINE SULFATE (PF) 4 MG/ML IV SOLN
4.0000 mg | INTRAVENOUS | Status: AC
  Administered 2015-06-22: 4 mg via INTRAVENOUS
  Filled 2015-06-22: qty 1

## 2015-06-22 MED ORDER — PHENOL 1.4 % MT LIQD
1.0000 | OROMUCOSAL | Status: DC | PRN
  Filled 2015-06-22: qty 177

## 2015-06-22 MED ORDER — PROMETHAZINE HCL 25 MG/ML IJ SOLN
INTRAMUSCULAR | Status: AC
  Administered 2015-06-22: 25 mg via INTRAVENOUS
  Filled 2015-06-22: qty 1

## 2015-06-22 MED ORDER — ONDANSETRON HCL 4 MG/2ML IJ SOLN
4.0000 mg | Freq: Four times a day (QID) | INTRAMUSCULAR | Status: DC | PRN
  Administered 2015-06-26: 4 mg via INTRAVENOUS

## 2015-06-22 MED ORDER — PANTOPRAZOLE SODIUM 40 MG IV SOLR
40.0000 mg | Freq: Every day | INTRAVENOUS | Status: DC
  Administered 2015-06-22 – 2015-06-25 (×4): 40 mg via INTRAVENOUS
  Filled 2015-06-22 (×5): qty 40

## 2015-06-22 MED ORDER — PROMETHAZINE HCL 25 MG/ML IJ SOLN
25.0000 mg | Freq: Once | INTRAMUSCULAR | Status: AC
  Administered 2015-06-22: 25 mg via INTRAVENOUS

## 2015-06-22 NOTE — ED Notes (Signed)
Bed: [id number] Expected date:  Expected time:  Means of arrival:  Comments: Transfer from med center

## 2015-06-22 NOTE — ED Provider Notes (Signed)
Patient sent from [facility] for surgical consultation of small bowel obstruction. An NG tube is in place and connected to low intermittent suction. Dr. Johna SheriffHoxworth will be notified of the patient's presence in the emergency department so that he may evaluate her here. She appears stable.  Nicole Lyonsouglas Fionna Merriott, MD 06/22/15 [phone]

## 2015-06-22 NOTE — ED Provider Notes (Signed)
CSN: [id number]     Arrival date & time 06/21/15  2102 History  By signing my name below, I, [name], attest that this documentation has been prepared under the direction and in the presence of Elsy Chiang, MD. Electronically Signed: Budd PalmerVanessa Short, ED Scribe. 06/22/2015. 1:23 AM.     Chief Complaint  Patient presents with  . Abdominal Pain   Patient is a 40 y.o. female presenting with abdominal pain. The history is provided by the patient. No language interpreter was used.  Abdominal Pain Pain location:  Generalized Pain quality: cramping   Pain radiates to:  Does not radiate Pain severity:  Mild Onset quality:  Gradual Duration:  1 week Timing:  Constant Progression:  Unchanged Chronicity:  Recurrent Context: laxative use and retching   Relieved by:  Nothing Worsened by:  Nothing tried Ineffective treatments:  Bowel activity, liquids, OTC medications and vomiting Associated symptoms: nausea and vomiting   Associated symptoms: no fever   Nausea:    Severity:  Mild   Onset quality:  Gradual   Duration:  1 week   Timing:  Constant   Progression:  Unchanged Vomiting:    Severity:  Mild   Duration:  1 week   Timing:  Sporadic   Progression:  Unchanged Risk factors: no alcohol abuse, has not had multiple surgeries and not pregnant    HPI Comments: [name] is a 40 y.o. female with a PMHx of acid reflux who presents to the Emergency Department complaining of constant, cramping, generalized abdominal pain onset 1 week ago. She reports associated nausea and vomiting. Pt states she was seen by her PCP at onset and diagnosed with constipation, when she was prescribed miralax and a nausea medication. She was then seen again 5 days ago for the same, because she could not tolerate the nausea medication, and given amitiza and zofran. She notes that sicne then she has had sevral BM's, but that the abdominal cramping has not yet resolved, which is why she came to the ED  today. She states she has taken zofran in the past with effective relief.   Past Medical History  Diagnosis Date  . Acid reflux    Past Surgical History  Procedure Laterality Date  . Abdominal hysterectomy     History reviewed. No pertinent family history. Social History  Substance Use Topics  . Smoking status: Never Smoker   . Smokeless tobacco: None  . Alcohol Use: No   OB History    No data available     Review of Systems  Constitutional: Negative for fever.  Gastrointestinal: Positive for nausea, vomiting and abdominal pain.  All other systems reviewed and are negative.   Allergies  Review of patient's allergies indicates no known allergies.  Home Medications   Prior to Admission medications   Medication Sig Start Date End Date Taking? Authorizing Provider  Lubiprostone (AMITIZA PO) Take by mouth.   Yes Historical Provider, MD  Ondansetron (ZOFRAN ODT PO) Take by mouth.   Yes Historical Provider, MD  PHENTERMINE HCL PO Take by mouth.   Yes Historical Provider, MD  omeprazole (PRILOSEC) 20 MG capsule Take 1 capsule (20 mg total) by mouth daily. 02/22/15   Stevi Barrett, PA-C   BP 124/78 mmHg  Pulse 110  Temp(Src) 98.3 F (36.8 C) (Oral)  Resp 18  Ht 5\' 10"  (1.778 m)  Wt 274 lb (124.286 kg)  BMI 39.32 kg/m2  SpO2 99% Physical Exam  Constitutional: She is oriented to person, place,  and time. She appears well-developed and well-nourished.  HENT:  Head: Normocephalic and atraumatic.  Eyes: Conjunctivae are normal. Pupils are equal, round, and reactive to light. Right eye exhibits no discharge. Left eye exhibits no discharge.  Neck: Normal range of motion. Neck supple.  Pulmonary/Chest: Effort normal. No respiratory distress. She has no wheezes. She has no rales.  Abdominal: Soft. Bowel sounds are increased. There is no tenderness. There is no rebound, no guarding, no tenderness at McBurney's point and negative Murphy's sign.  Neurological: She is alert and  oriented to person, place, and time. Coordination normal.  Skin: Skin is warm and dry. No rash noted. She is not diaphoretic. No erythema.  Psychiatric: She has a normal mood and affect.  Nursing note and vitals reviewed.   ED Course  Procedures  DIAGNOSTIC STUDIES: Oxygen Saturation is 99% on RA, normal by my interpretation.    COORDINATION OF CARE: 12:55 AM - Discussed plans to . Pt advised of plan for treatment and pt agrees.  Labs Review Labs Reviewed  URINALYSIS, ROUTINE W REFLEX MICROSCOPIC (NOT AT [facility]) - Abnormal; Notable for the following:    Color, Urine ORANGE (*)    APPearance CLOUDY (*)    Hgb urine dipstick MODERATE (*)    Bilirubin Urine SMALL (*)    Ketones, ur 15 (*)    Protein, ur 100 (*)    Leukocytes, UA TRACE (*)    All other components within normal limits  URINE MICROSCOPIC-ADD ON - Abnormal; Notable for the following:    Squamous Epithelial / LPF 6-30 (*)    Bacteria, UA MANY (*)    Casts GRANULAR CAST (*)    All other components within normal limits    Imaging Review Dg Abd Acute W/chest  06/22/2015  CLINICAL DATA:  Abdominal pain, nausea and vomiting yesterday. Seen for the same symptoms last Monday. CT again on Wednesday. Persistent abdominal pain. EXAM: DG ABDOMEN ACUTE W/ 1V CHEST COMPARISON:  Chest 02/22/2015 FINDINGS: Normal heart size and pulmonary vascularity. No focal airspace disease or consolidation in the lungs. No blunting of costophrenic angles. No pneumothorax. Mediastinal contours appear intact. Multiple gas-filled mildly distended left upper quadrant and mid abdominal small bowel loops with multiple air-fluid levels. Appearance is consistent with small bowel obstruction. No free intra-abdominal air. Paucity of gas in the colon. No radiopaque stones. Visualized bones appear intact. IMPRESSION: Gas distended small bowel with air-fluid levels consistent with small bowel obstruction. Electronically Signed   By: Burman Nieves M.D.   On:  06/22/2015 01:11   I have personally reviewed and evaluated these images and lab results as part of my medical decision-making.   EKG Interpretation None      MDM   Final diagnoses:  None   Results for orders placed or performed during the hospital encounter of 06/22/15  Urinalysis, Routine w reflex microscopic (not at [facility])  Result Value Ref Range   Color, Urine ORANGE (A) YELLOW   APPearance CLOUDY (A) CLEAR   Specific Gravity, Urine 1.027 1.005 - 1.030   pH 6.0 5.0 - 8.0   Glucose, UA NEGATIVE NEGATIVE mg/dL   Hgb urine dipstick MODERATE (A) NEGATIVE   Bilirubin Urine SMALL (A) NEGATIVE   Ketones, ur 15 (A) NEGATIVE mg/dL   Protein, ur 161 (A) NEGATIVE mg/dL   Nitrite NEGATIVE NEGATIVE   Leukocytes, UA TRACE (A) NEGATIVE  Urine microscopic-add on  Result Value Ref Range   Squamous Epithelial / LPF 6-30 (A) NONE SEEN   WBC,  UA 0-5 0 - 5 WBC/hpf   RBC / HPF 6-30 0 - 5 RBC/hpf   Bacteria, UA MANY (A) NONE SEEN   Casts GRANULAR CAST (A) NEGATIVE   Urine-Other MUCOUS PRESENT   CBC with Differential/Platelet  Result Value Ref Range   WBC 7.8 4.0 - 10.5 K/uL   RBC 5.27 (H) 3.87 - 5.11 MIL/uL   Hemoglobin 14.0 12.0 - 15.0 g/dL   HCT 16.1 09.6 - 04.5 %   MCV 81.2 78.0 - 100.0 fL   MCH 26.6 26.0 - 34.0 pg   MCHC 32.7 30.0 - 36.0 g/dL   RDW 40.9 81.1 - 91.4 %   Platelets 282 150 - 400 K/uL   Neutrophils Relative % 45 %   Lymphocytes Relative 23 %   Monocytes Relative 20 %   Eosinophils Relative 0 %   Basophils Relative 0 %   Band Neutrophils 12 %   Metamyelocytes Relative 0 %   Myelocytes 0 %   Promyelocytes Absolute 0 %   Blasts 0 %   nRBC 0 0 /100 WBC   Other 0 %   Neutro Abs 4.4 1.7 - 7.7 K/uL   Lymphs Abs 1.8 0.7 - 4.0 K/uL   Monocytes Absolute 1.6 (H) 0.1 - 1.0 K/uL   Eosinophils Absolute 0.0 0.0 - 0.7 K/uL   Basophils Absolute 0.0 0.0 - 0.1 K/uL   WBC Morphology TOXIC GRANULATION   Basic metabolic panel  Result Value Ref Range   Sodium 134 (L) 135 -  145 mmol/L   Potassium 3.2 (L) 3.5 - 5.1 mmol/L   Chloride 95 (L) 101 - 111 mmol/L   CO2 29 22 - 32 mmol/L   Glucose, Bld 109 (H) 65 - 99 mg/dL   BUN 16 6 - 20 mg/dL   Creatinine, Ser 7.82 (H) 0.44 - 1.00 mg/dL   Calcium 9.0 8.9 - 95.6 mg/dL   GFR calc non Af Amer >60 >60 mL/min   GFR calc Af Amer >60 >60 mL/min   Anion gap 10 5 - 15   Ct Abdomen Pelvis W Contrast  06/22/2015  CLINICAL DATA:  Constant cramping generalized abdominal pain for a week. Nausea and vomiting. EXAM: CT ABDOMEN AND PELVIS WITH CONTRAST TECHNIQUE: Multidetector CT imaging of the abdomen and pelvis was performed using the standard protocol following bolus administration of intravenous contrast. CONTRAST:  25mL OMNIPAQUE IOHEXOL 300 MG/ML SOLN, OMNIPAQUE IOHEXOL 300 MG/ML SOLN COMPARISON:  11/21/2006 FINDINGS: Dependent atelectasis in the lung bases. The liver, spleen, gallbladder, pancreas, adrenal glands, kidneys, abdominal aorta, inferior vena cava, and retroperitoneal lymph nodes are unremarkable. Proximal small bowel are distended with gas and fluid and multiple air-fluid levels are present. There is mild edema in the mesenteric. No small bowel wall thickening. Distal small bowel are decompressed. Appearance is consistent with small bowel obstruction. Transition zone appears to be in the right lower quadrant although is not specifically identified. Cause is not identified. No free air in the abdomen. Pelvis: Bladder wall is not thickened. Uterus is surgically absent. No pelvic mass or lymphadenopathy. No free or loculated pelvic fluid collections. The appendix is normal. No evidence of vasculitis. No destructive bone lesions. IMPRESSION: Distended small bowel with air-fluid levels consistent with small bowel obstruction. Transition zone is not specifically identified but appears to be within the right lower quadrant. Distal ileum are decompressed. Small amount of mesenteric edema. No small bowel wall thickening.  Electronically Signed   By: Burman Nieves M.D.   On: 06/22/2015 03:11  Dg Abd Acute W/chest  06/22/2015  CLINICAL DATA:  Abdominal pain, nausea and vomiting yesterday. Seen for the same symptoms last Monday. CT again on Wednesday. Persistent abdominal pain. EXAM: DG ABDOMEN ACUTE W/ 1V CHEST COMPARISON:  Chest 02/22/2015 FINDINGS: Normal heart size and pulmonary vascularity. No focal airspace disease or consolidation in the lungs. No blunting of costophrenic angles. No pneumothorax. Mediastinal contours appear intact. Multiple gas-filled mildly distended left upper quadrant and mid abdominal small bowel loops with multiple air-fluid levels. Appearance is consistent with small bowel obstruction. No free intra-abdominal air. Paucity of gas in the colon. No radiopaque stones. Visualized bones appear intact. IMPRESSION: Gas distended small bowel with air-fluid levels consistent with small bowel obstruction. Electronically Signed   By: Burman Nieves M.D.   On: 06/22/2015 01:11   ' Medications  fentaNYL (SUBLIMAZE) injection 100 mcg (not administered)  sodium chloride 0.9 % bolus 1,000 mL (not administered)  promethazine (PHENERGAN) injection 25 mg (not administered)  ondansetron (ZOFRAN) injection 4 mg (4 mg Intravenous Given 06/22/15 0206)  sodium chloride 0.9 % bolus 1,000 mL (1,000 mLs Intravenous New Bag/Given 06/22/15 0207)  ketorolac (TORADOL) 30 MG/ML injection 30 mg (30 mg Intravenous Given 06/22/15 0206)  iohexol (OMNIPAQUE) 300 MG/ML solution 25 mL (25 mLs Oral Contrast Given 06/22/15 0145)  iohexol (OMNIPAQUE) 300 MG/ML solution 100 mL (100 mLs Intravenous Contrast Given 06/22/15 0246)   Case d/w Dr. Johna Sheriff to the ED at [facility] to be seen by [facility] Case d/w Dr. Judd Lien who is aware of patient  Case d/w Misty Stanley charge at [facility]  I personally performed the services described in this documentation, which was scribed in my presence. The recorded information has been reviewed and is accurate.     Cy Blamer, MD 06/22/15 [id number]

## 2015-06-22 NOTE — H&P (Signed)
[name] is an 40 y.o. female.    Chief Complaint: Abdominal pain, vomiting  HPI: Patient is a 40 year old female who 6 days ago developed the onset of sharp periumbilical abdominal pain.  She describes the pain as constant and moderately severe. Onset was gradual.  Soon after the onset of the pain she began to have frequent nausea and vomiting. Unable to tolerate much of anything by mouth. She also did not have any bowel movements. She saw her primary physician who thought she was constipated and she was treated with laxatives. This caused some increased vomiting but she did begin to have bowel movements and describes normal bowel movements over the last few days. However the pain has continued  As has been vomiting. She feels a little distended. She presented to the emergency department last night. No history of any previous similar episodes. Her only surgeries have been a hysterectomy and C-section done through a Pfannenstiel incision. No urinary symptoms. No fever or chills.  Past Medical History  Diagnosis Date  . Acid reflux     Past Surgical History  Procedure Laterality Date  . Abdominal hysterectomy      History reviewed. No pertinent family history. Social History:  reports that she has never smoked. She does not have any smokeless tobacco history on file. She reports that she does not drink alcohol or use illicit drugs.  Allergies: No Known Allergies   Current Facility-Administered Medications  Medication Dose Route Frequency Provider Last Rate Last Dose  . morphine 4 MG/ML injection 4 mg  4 mg Intravenous NOW Excell Seltzer, MD       Current Outpatient Prescriptions  Medication Sig Dispense Refill  . lubiprostone (AMITIZA) 24 MCG capsule Take 24 mcg by mouth 2 (two) times daily.    . Multiple Vitamin (MULTIVITAMIN WITH MINERALS) TABS tablet Take 1 tablet by mouth daily.    Marland Kitchen omeprazole (PRILOSEC) 20 MG capsule Take 1 capsule (20 mg total) by mouth daily. 30  capsule 0  . ondansetron (ZOFRAN-ODT) 4 MG disintegrating tablet Take 4 mg by mouth every 4 (four) hours as needed. For nausea  0  . phentermine (ADIPEX-P) 37.5 MG tablet Take 37.5 mg by mouth daily.  1     Results for orders placed or performed during the hospital encounter of 06/22/15 (from the past 48 hour(s))  Urinalysis, Routine w reflex microscopic (not at [facility])     Status: Abnormal   Collection Time: 06/21/15 10:09 PM  Result Value Ref Range   Color, Urine ORANGE (A) YELLOW    Comment: BIOCHEMICALS MAY BE AFFECTED BY COLOR   APPearance CLOUDY (A) CLEAR   Specific Gravity, Urine 1.027 1.005 - 1.030   pH 6.0 5.0 - 8.0   Glucose, UA NEGATIVE NEGATIVE mg/dL   Hgb urine dipstick MODERATE (A) NEGATIVE   Bilirubin Urine SMALL (A) NEGATIVE   Ketones, ur 15 (A) NEGATIVE mg/dL   Protein, ur 100 (A) NEGATIVE mg/dL   Nitrite NEGATIVE NEGATIVE   Leukocytes, UA TRACE (A) NEGATIVE  Urine microscopic-add on     Status: Abnormal   Collection Time: 06/21/15 10:09 PM  Result Value Ref Range   Squamous Epithelial / LPF 6-30 (A) NONE SEEN   WBC, UA 0-5 0 - 5 WBC/hpf   RBC / HPF 6-30 0 - 5 RBC/hpf   Bacteria, UA MANY (A) NONE SEEN   Casts GRANULAR CAST (A) NEGATIVE   Urine-Other MUCOUS PRESENT   CBC with Differential/Platelet     Status: Abnormal  Collection Time: 06/22/15  1:50 AM  Result Value Ref Range   WBC 7.8 4.0 - 10.5 K/uL   RBC 5.27 (H) 3.87 - 5.11 MIL/uL   Hemoglobin 14.0 12.0 - 15.0 g/dL   HCT 42.8 36.0 - 46.0 %   MCV 81.2 78.0 - 100.0 fL   MCH 26.6 26.0 - 34.0 pg   MCHC 32.7 30.0 - 36.0 g/dL   RDW 13.3 11.5 - 15.5 %   Platelets 282 150 - 400 K/uL   Neutrophils Relative % 45 %   Lymphocytes Relative 23 %   Monocytes Relative 20 %   Eosinophils Relative 0 %   Basophils Relative 0 %   Band Neutrophils 12 %   Metamyelocytes Relative 0 %   Myelocytes 0 %   Promyelocytes Absolute 0 %   Blasts 0 %   nRBC 0 0 /100 WBC   Other 0 %   Neutro Abs 4.4 1.7 - 7.7 K/uL   Lymphs  Abs 1.8 0.7 - 4.0 K/uL   Monocytes Absolute 1.6 (H) 0.1 - 1.0 K/uL   Eosinophils Absolute 0.0 0.0 - 0.7 K/uL   Basophils Absolute 0.0 0.0 - 0.1 K/uL   WBC Morphology TOXIC GRANULATION     Comment: ATYPICAL LYMPHOCYTES  Basic metabolic panel     Status: Abnormal   Collection Time: 06/22/15  1:50 AM  Result Value Ref Range   Sodium 134 (L) 135 - 145 mmol/L   Potassium 3.2 (L) 3.5 - 5.1 mmol/L   Chloride 95 (L) 101 - 111 mmol/L   CO2 29 22 - 32 mmol/L   Glucose, Bld 109 (H) 65 - 99 mg/dL   BUN 16 6 - 20 mg/dL   Creatinine, Ser 1.10 (H) 0.44 - 1.00 mg/dL   Calcium 9.0 8.9 - 10.3 mg/dL   GFR calc non Af Amer >60 >60 mL/min   GFR calc Af Amer >60 >60 mL/min    Comment: (NOTE) The eGFR has been calculated using the CKD EPI equation. This calculation has not been validated in all clinical situations. eGFR's persistently <60 mL/min signify possible Chronic Kidney Disease.    Anion gap 10 5 - 15   Ct Abdomen Pelvis W Contrast  06/22/2015  CLINICAL DATA:  Constant cramping generalized abdominal pain for a week. Nausea and vomiting. EXAM: CT ABDOMEN AND PELVIS WITH CONTRAST TECHNIQUE: Multidetector CT imaging of the abdomen and pelvis was performed using the standard protocol following bolus administration of intravenous contrast. CONTRAST:  9m OMNIPAQUE IOHEXOL 300 MG/ML SOLN, 1071mOMNIPAQUE IOHEXOL 300 MG/ML SOLN COMPARISON:  11/21/2006 FINDINGS: Dependent atelectasis in the lung bases. The liver, spleen, gallbladder, pancreas, adrenal glands, kidneys, abdominal aorta, inferior vena cava, and retroperitoneal lymph nodes are unremarkable. Proximal small bowel are distended with gas and fluid and multiple air-fluid levels are present. There is mild edema in the mesenteric. No small bowel wall thickening. Distal small bowel are decompressed. Appearance is consistent with small bowel obstruction. Transition zone appears to be in the right lower quadrant although is not specifically identified. Cause  is not identified. No free air in the abdomen. Pelvis: Bladder wall is not thickened. Uterus is surgically absent. No pelvic mass or lymphadenopathy. No free or loculated pelvic fluid collections. The appendix is normal. No evidence of vasculitis. No destructive bone lesions. IMPRESSION: Distended small bowel with air-fluid levels consistent with small bowel obstruction. Transition zone is not specifically identified but appears to be within the right lower quadrant. Distal ileum are decompressed. Small amount of mesenteric  edema. No small bowel wall thickening. Electronically Signed   By: Lucienne Capers M.D.   On: 06/22/2015 03:11   Dg Abd Acute W/chest  06/22/2015  CLINICAL DATA:  Abdominal pain, nausea and vomiting yesterday. Seen for the same symptoms last Monday. CT again on Wednesday. Persistent abdominal pain. EXAM: DG ABDOMEN ACUTE W/ 1V CHEST COMPARISON:  Chest 02/22/2015 FINDINGS: Normal heart size and pulmonary vascularity. No focal airspace disease or consolidation in the lungs. No blunting of costophrenic angles. No pneumothorax. Mediastinal contours appear intact. Multiple gas-filled mildly distended left upper quadrant and mid abdominal small bowel loops with multiple air-fluid levels. Appearance is consistent with small bowel obstruction. No free intra-abdominal air. Paucity of gas in the colon. No radiopaque stones. Visualized bones appear intact. IMPRESSION: Gas distended small bowel with air-fluid levels consistent with small bowel obstruction. Electronically Signed   By: Lucienne Capers M.D.   On: 06/22/2015 01:11    Review of Systems  Constitutional: Negative for fever and chills.  Respiratory: Negative.   Cardiovascular: Negative.   Gastrointestinal: Positive for nausea, vomiting, abdominal pain and constipation.  Genitourinary: Negative.   Musculoskeletal: Negative.     Blood pressure 138/87, pulse 112, temperature 98.4 F (36.9 C), temperature source Oral, resp. rate 20,  height _0  (1.778 m), weight 124.286 kg (274 lb), SpO2 97 %. Physical Exam  General: Alert, Obese African-American female who appears uncomfortable  But in no severe distress. Skin: Warm and dry without rash or infection. HEENT: No palpable masses or thyromegaly. Sclera nonicteric. Pupils equal round and reactive. Oropharynx clear. Lymph nodes: No cervical, supraclavicular,  nodes palpable. Lungs: Breath sounds clear and equal without increased work of breathing Cardiovascular: Regular tachycardia without murmur. No JVD or edema. Peripheral pulses intact. Abdomen: Possibly some distention but obese.. Mild diffuse tenderness without guarding. No masses palpable. No organomegaly. No palpable hernias. Extremities: No edema or joint swelling or deformity. No chronic venous stasis changes. Neurologic: Alert and fully oriented. Affect normal.. No gross motor deficits.   Assessment/Plan Small bowel obstruction likely secondary to adhesions with history of previous hysterectomy. NG tube has been placed. Patient will be admitted for IV fluids, pain medication and bowel rest. Will start small bowel obstruction protocol with Gastrografin per NG tube.  Edward Jolly, MD 06/22/2015, 6:40 AM

## 2015-06-22 NOTE — Progress Notes (Signed)
General Surgery [facility]  Patient seen and examined.  AXR reviewed.  NG in and working.  Patient states feels less distended, more comfortable.  No flatus or BM.  Continue per small bowel protocol as ordered by Dr. Johna SheriffHoxworth.  Velora Hecklerodd M. Zia Kanner, MD, [facility] Office: [phone]

## 2015-06-22 NOTE — Progress Notes (Addendum)
NG clamped.Gastrografin 90 ml given per NG tube.After 1 hour NG tube unclamped and put back to LIS. Spoke with radiology to inform them of the time NG was clamped. KUB will be done @ 1920 tonight. Family at bedside

## 2015-06-23 LAB — CBC
HCT: 39.8 % (ref 36.0–46.0)
Hemoglobin: 12.5 g/dL (ref 12.0–15.0)
MCH: 26.7 pg (ref 26.0–34.0)
MCHC: 31.4 g/dL (ref 30.0–36.0)
MCV: 84.9 fL (ref 78.0–100.0)
PLATELETS: 267 10*3/uL (ref 150–400)
RBC: 4.69 MIL/uL (ref 3.87–5.11)
RDW: 13.6 % (ref 11.5–15.5)
WBC: 6.5 10*3/uL (ref 4.0–10.5)

## 2015-06-23 LAB — BASIC METABOLIC PANEL
Anion gap: 10 (ref 5–15)
BUN: 14 mg/dL (ref 6–20)
CALCIUM: 8.8 mg/dL — AB (ref 8.9–10.3)
CO2: 31 mmol/L (ref 22–32)
CREATININE: 1.17 mg/dL — AB (ref 0.44–1.00)
Chloride: 99 mmol/L — ABNORMAL LOW (ref 101–111)
GFR, EST NON AFRICAN AMERICAN: 57 mL/min — AB (ref >60)
Glucose, Bld: 116 mg/dL — ABNORMAL HIGH (ref 65–99)
Potassium: 3.5 mmol/L (ref 3.5–5.1)
SODIUM: 140 mmol/L (ref 135–145)

## 2015-06-23 MED ORDER — SODIUM CHLORIDE 0.9 % IV BOLUS (SEPSIS)
500.0000 mL | Freq: Once | INTRAVENOUS | Status: AC
  Administered 2015-06-23: 500 mL via INTRAVENOUS

## 2015-06-23 MED ORDER — SODIUM CHLORIDE 0.9 % IV BOLUS (SEPSIS)
1000.0000 mL | Freq: Once | INTRAVENOUS | Status: AC
Start: 2015-06-23 — End: 2015-06-23
  Administered 2015-06-23: 1000 mL via INTRAVENOUS

## 2015-06-23 MED ORDER — SODIUM CHLORIDE 0.9 % IV SOLN
INTRAVENOUS | Status: DC
  Administered 2015-06-23: 1000 mL via INTRAVENOUS
  Administered 2015-06-24 (×2): via INTRAVENOUS

## 2015-06-23 MED ORDER — KCL IN DEXTROSE-NACL 40-5-0.9 MEQ/L-%-% IV SOLN
INTRAVENOUS | Status: DC
  Administered 2015-06-23: 17:00:00 via INTRAVENOUS
  Filled 2015-06-23 (×7): qty 1000

## 2015-06-23 NOTE — Progress Notes (Signed)
Subjective: She feels better but still putting out allot thru the NG, no flatus or BM.    Objective: Vital signs in last 24 hours: Temp:  [97.9 F (36.6 C)-98.9 F (37.2 C)] 98.8 F (37.1 C) (03/21 0521) Pulse Rate:  [106-115] 113 (03/21 0521) Resp:  [18-19] 19 (03/21 0521) BP: (127-138)/(65-81) 132/81 mmHg (03/21 0521) SpO2:  [91 %-98 %] 95 % (03/21 0521) Last BM Date: 06/21/15 60 Po  NPO 200 urine  NG 3075 recorded Afebrile, Tachycardic BP is stable Creatinine is up, K+ is a little better WBC is normal   24 hour SB protocol: No significant passage of contrast from the stomach into the small bowel despite 8 hours of follow-up. Followup film at 24 hours following administration is recommended Intake/Output from previous day: 03/20 0701 - 03/21 0700 In: 3489.6 [P.O.:60; I.V.:2564.6; NG/GT:150] Out: 3275 [Urine:200; Emesis/NG output:3075] Intake/Output this shift:    General appearance: alert, cooperative and no distress Resp: clear to auscultation bilaterally GI: soft, BS hypoactive, no flatus or Bm, not really tender.  feels better with decompression.  Lab Results:   Recent Labs  06/22/15 0150 06/23/15 0455  WBC 7.8 6.5  HGB 14.0 12.5  HCT 42.8 39.8  PLT 282 267    BMET  Recent Labs  06/22/15 0150 06/23/15 0455  NA 134* 140  K 3.2* 3.5  CL 95* 99*  CO2 29 31  GLUCOSE 109* 116*  BUN 16 14  CREATININE 1.10* 1.17*  CALCIUM 9.0 8.8*   PT/INR No results for input(s): LABPROT, INR in the last 72 hours.  No results for input(s): AST, ALT, ALKPHOS, BILITOT, PROT, ALBUMIN in the last 168 hours.   Lipase  No results found for: LIPASE   Studies/Results: Ct Abdomen Pelvis W Contrast  06/22/2015  CLINICAL DATA:  Constant cramping generalized abdominal pain for a week. Nausea and vomiting. EXAM: CT ABDOMEN AND PELVIS WITH CONTRAST TECHNIQUE: Multidetector CT imaging of the abdomen and pelvis was performed using the standard protocol following bolus  administration of intravenous contrast. CONTRAST:  25mL OMNIPAQUE IOHEXOL 300 MG/ML SOLN, 100mL OMNIPAQUE IOHEXOL 300 MG/ML SOLN COMPARISON:  11/21/2006 FINDINGS: Dependent atelectasis in the lung bases. The liver, spleen, gallbladder, pancreas, adrenal glands, kidneys, abdominal aorta, inferior vena cava, and retroperitoneal lymph nodes are unremarkable. Proximal small bowel are distended with gas and fluid and multiple air-fluid levels are present. There is mild edema in the mesenteric. No small bowel wall thickening. Distal small bowel are decompressed. Appearance is consistent with small bowel obstruction. Transition zone appears to be in the right lower quadrant although is not specifically identified. Cause is not identified. No free air in the abdomen. Pelvis: Bladder wall is not thickened. Uterus is surgically absent. No pelvic mass or lymphadenopathy. No free or loculated pelvic fluid collections. The appendix is normal. No evidence of vasculitis. No destructive bone lesions. IMPRESSION: Distended small bowel with air-fluid levels consistent with small bowel obstruction. Transition zone is not specifically identified but appears to be within the right lower quadrant. Distal ileum are decompressed. Small amount of mesenteric edema. No small bowel wall thickening. Electronically Signed   By: Burman NievesWilliam  Stevens M.D.   On: 06/22/2015 03:11   Dg Abd Acute W/chest  06/22/2015  CLINICAL DATA:  Abdominal pain, nausea and vomiting yesterday. Seen for the same symptoms last Monday. CT again on Wednesday. Persistent abdominal pain. EXAM: DG ABDOMEN ACUTE W/ 1V CHEST COMPARISON:  Chest 02/22/2015 FINDINGS: Normal heart size and pulmonary vascularity. No focal airspace disease or  consolidation in the lungs. No blunting of costophrenic angles. No pneumothorax. Mediastinal contours appear intact. Multiple gas-filled mildly distended left upper quadrant and mid abdominal small bowel loops with multiple air-fluid levels.  Appearance is consistent with small bowel obstruction. No free intra-abdominal air. Paucity of gas in the colon. No radiopaque stones. Visualized bones appear intact. IMPRESSION: Gas distended small bowel with air-fluid levels consistent with small bowel obstruction. Electronically Signed   By: Burman Nieves M.D.   On: 06/22/2015 01:11   Dg Abd Portable 1v-small Bowel Obstruction Protocol-initial, 8 Hr Delay  06/22/2015  CLINICAL DATA:  Small-bowel obstruction, 8 hour film EXAM: PORTABLE ABDOMEN - 1 VIEW COMPARISON:  06/22/2015 FINDINGS: Nasogastric catheter is again noted within the stomach. Contrast material is been administered but still lies within the stomach. There has been no significant passage of contrast distally after 8 hours of administration. Persistent small bowel dilatation is seen. No free air is noted. IMPRESSION: No significant passage of contrast from the stomach into the small bowel despite 8 hours of follow-up. Followup film at 24 hours following administration is recommended. Electronically Signed   By: Alcide Clever M.D.   On: 06/22/2015 19:45   Dg Abd Portable 1v-small Bowel Protocol-position Verification  06/22/2015  CLINICAL DATA:  NG tube placement EXAM: PORTABLE ABDOMEN - 1 VIEW COMPARISON:  06/22/2015 FINDINGS: Again noted distended small bowel loops consistent with small bowel obstruction. There is NG-tube coiled within proximal stomach with tip in mid stomach. IMPRESSION: NG tube coiled within proximal stomach with tip in mid stomach. Again noted gaseous distended small bowel loops consistent with small bowel obstruction. Electronically Signed   By: Natasha Mead M.D.   On: 06/22/2015 10:23    Medications: . antiseptic oral rinse  7 mL Mouth Rinse q12n4p  . chlorhexidine  15 mL Mouth Rinse BID  . enoxaparin (LOVENOX) injection  40 mg Subcutaneous Q24H  . pantoprazole (PROTONIX) IV  40 mg Intravenous QHS    Assessment/Plan SBO  S/p laparoscopy, lysis of adhesions, total  abdomina hysterectomy, cystoscopy, 06/18/07, Nicole A. [name], M.D. Constipation  Antibiotics:  None DVT:  Lovenox/SCD  Plan:  She has had issues with constipation since she had her hysterectomy, 2009.  If she does not improve we may need to consider surgery in the AM.  i am going to increase her fluids and recheck labs in AM also. Next film in protocol is tonight at 1900.  Film in AM also. Urine is only 300 since the bolus, I am going to up her fluids with bolus again and add more K+ to the IV fluids.   LOS: 1 day    [name] 06/23/2015

## 2015-06-24 ENCOUNTER — Inpatient Hospital Stay (HOSPITAL_COMMUNITY): Payer: 59

## 2015-06-24 LAB — BASIC METABOLIC PANEL
Anion gap: 8 (ref 5–15)
BUN: 14 mg/dL (ref 6–20)
CALCIUM: 8.6 mg/dL — AB (ref 8.9–10.3)
CHLORIDE: 105 mmol/L (ref 101–111)
CO2: 27 mmol/L (ref 22–32)
CREATININE: 1.07 mg/dL — AB (ref 0.44–1.00)
GFR calc Af Amer: >60 mL/min (ref >60)
GFR calc non Af Amer: >60 mL/min (ref >60)
Glucose, Bld: 98 mg/dL (ref 65–99)
Potassium: 3.6 mmol/L (ref 3.5–5.1)
Sodium: 140 mmol/L (ref 135–145)

## 2015-06-24 LAB — CBC
HEMATOCRIT: 37 % (ref 36.0–46.0)
HEMOGLOBIN: 11.5 g/dL — AB (ref 12.0–15.0)
MCH: 26.7 pg (ref 26.0–34.0)
MCHC: 31.1 g/dL (ref 30.0–36.0)
MCV: 85.8 fL (ref 78.0–100.0)
Platelets: 252 10*3/uL (ref 150–400)
RBC: 4.31 MIL/uL (ref 3.87–5.11)
RDW: 13.6 % (ref 11.5–15.5)
WBC: 7.7 10*3/uL (ref 4.0–10.5)

## 2015-06-24 LAB — MAGNESIUM: Magnesium: 2 mg/dL (ref 1.7–2.4)

## 2015-06-24 NOTE — Progress Notes (Signed)
Its been 8 hours since patient last urine output,. Patient said she don't feel the  urge to pee and denies fullness or pressure on her bladder, . Bladder scan shows 200cc or urine. Page and notified on call provider.

## 2015-06-24 NOTE — Progress Notes (Signed)
Subjective: She feels better.  Abdomen is soft, not distended and no pain.  Some flatus but not much.  Objective: Vital signs in last 24 hours: Temp:  [97.6 F (36.4 C)-99.4 F (37.4 C)] 99.2 F (37.3 C) (03/22 0513) Pulse Rate:  [112-120] 120 (03/22 0513) Resp:  [16-20] 18 (03/22 0513) BP: (127-149)/(72-81) 149/81 mmHg (03/22 0513) SpO2:  [98 %-100 %] 99 % (03/22 0513) Last BM Date: 06/21/14 1950 from NG  NPO 800 urine Afebrile, VSS  HR  Tachycardic  Labs OK  Film this AM shows: Loops of dilated left upper quadrant small bowel again identified. Dilatation is slightly less, decreasing from a maximum diameter of 42 mm previously to 36 mm currently. There are few or dilated loops. There is progression of some gas into distal small bowel and proximal colon. Intake/Output from previous day: 03/21 0701 - 03/22 0700 In: 3973.8 [I.V.:2627.1; NG/GT:30; IV Piggyback:1316.7] Out: 2750 [Urine:800; Emesis/NG output:1950] Intake/Output this shift: Total I/O In: 30 [NG/GT:30] Out: 1200 [Urine:200; Emesis/NG output:1000]  General appearance: alert and cooperative GI: soft, non-tender; bowel sounds normal; no masses,  no organomegaly  Lab Results:   Recent Labs  06/23/15 0455 06/24/15 0425  WBC 6.5 7.7  HGB 12.5 11.5*  HCT 39.8 37.0  PLT 267 252    BMET  Recent Labs  06/23/15 0455 06/24/15 0425  NA 140 140  K 3.5 3.6  CL 99* 105  CO2 31 27  GLUCOSE 116* 98  BUN 14 14  CREATININE 1.17* 1.07*  CALCIUM 8.8* 8.6*   PT/INR No results for input(s): LABPROT, INR in the last 72 hours.  No results for input(s): AST, ALT, ALKPHOS, BILITOT, PROT, ALBUMIN in the last 168 hours.   Lipase  No results found for: LIPASE   Studies/Results: Dg Abd Portable 1v-small Bowel Obstruction Protocol-initial, 8 Hr Delay  06/22/2015  CLINICAL DATA:  Small-bowel obstruction, 8 hour film EXAM: PORTABLE ABDOMEN - 1 VIEW COMPARISON:  06/22/2015 FINDINGS: Nasogastric catheter is again  noted within the stomach. Contrast material is been administered but still lies within the stomach. There has been no significant passage of contrast distally after 8 hours of administration. Persistent small bowel dilatation is seen. No free air is noted. IMPRESSION: No significant passage of contrast from the stomach into the small bowel despite 8 hours of follow-up. Followup film at 24 hours following administration is recommended. Electronically Signed   By: Alcide CleverMark  Lukens M.D.   On: 06/22/2015 19:45   Dg Abd Portable 1v-small Bowel Protocol-position Verification  06/22/2015  CLINICAL DATA:  NG tube placement EXAM: PORTABLE ABDOMEN - 1 VIEW COMPARISON:  06/22/2015 FINDINGS: Again noted distended small bowel loops consistent with small bowel obstruction. There is NG-tube coiled within proximal stomach with tip in mid stomach. IMPRESSION: NG tube coiled within proximal stomach with tip in mid stomach. Again noted gaseous distended small bowel loops consistent with small bowel obstruction. Electronically Signed   By: Natasha MeadLiviu  Pop M.D.   On: 06/22/2015 10:23    Medications: . antiseptic oral rinse  7 mL Mouth Rinse q12n4p  . chlorhexidine  15 mL Mouth Rinse BID  . enoxaparin (LOVENOX) injection  40 mg Subcutaneous Q24H  . pantoprazole (PROTONIX) IV  40 mg Intravenous QHS    Assessment/Plan SBO S/p laparoscopy, lysis of adhesions, total abdominal hysterectomy, cystoscopy, 06/18/07, [name] A. Normand Sloopillard, M.D. Constipation Antibiotics: None DVT: Lovenox/SCD    Plan:  Continue NG she is making some progress.       LOS: 2 days  Kacee Koren 06/24/2015

## 2015-06-25 ENCOUNTER — Inpatient Hospital Stay (HOSPITAL_COMMUNITY): Payer: 59

## 2015-06-25 LAB — BASIC METABOLIC PANEL
ANION GAP: 12 (ref 5–15)
BUN: 16 mg/dL (ref 6–20)
CALCIUM: 8.9 mg/dL (ref 8.9–10.3)
CO2: 26 mmol/L (ref 22–32)
CREATININE: 0.92 mg/dL (ref 0.44–1.00)
Chloride: 105 mmol/L (ref 101–111)
Glucose, Bld: 92 mg/dL (ref 65–99)
Potassium: 3.9 mmol/L (ref 3.5–5.1)
Sodium: 143 mmol/L (ref 135–145)

## 2015-06-25 MED ORDER — KCL IN DEXTROSE-NACL 20-5-0.45 MEQ/L-%-% IV SOLN
INTRAVENOUS | Status: DC
  Administered 2015-06-25: 1000 mL via INTRAVENOUS
  Administered 2015-06-26: 01:00:00 via INTRAVENOUS
  Filled 2015-06-25 (×4): qty 1000

## 2015-06-25 MED ORDER — BISACODYL 10 MG RE SUPP
10.0000 mg | Freq: Once | RECTAL | Status: AC
  Administered 2015-06-25: 10 mg via RECTAL
  Filled 2015-06-25: qty 1

## 2015-06-25 NOTE — Progress Notes (Signed)
Subjective: She is about the same she doesn't complain.    Objective: Vital signs in last 24 hours: Temp:  [99 F (37.2 C)-99.6 F (37.6 C)] 99 F (37.2 C) (03/23 0532) Pulse Rate:  [113-128] 113 (03/23 0532) Resp:  [16-18] 16 (03/23 0532) BP: (130-141)/(69-90) 139/90 mmHg (03/23 0532) SpO2:  [97 %-100 %] 100 % (03/23 0532) Last BM Date: 06/21/14 3100 from the NG  She is negative 4 liters Afebrile, VSS  HR still 120 range No labs,  Film today:  The nasogastric tube tip lies in the distal pylorus or proximal duodenum. There is small amount of gas within the stomach. There remain loops of moderately distended gas-filled small bowel in the midline. The descending colon contains a small amount of formed stool. There is stool and gas in the rectum. The bony structures exhibit no acute abnormalities. There are phleboliths within the pelvis. Intake/Output from previous day: 03/22 0701 - 03/23 0700 In: 60 [NG/GT:60] Out: 4250 [Urine:1150; Emesis/NG output:3100] Intake/Output this shift:    General appearance: alert, cooperative and no distress GI: soft, few BS, no flatus or BM  Lab Results:   Recent Labs  06/23/15 0455 06/24/15 0425  WBC 6.5 7.7  HGB 12.5 11.5*  HCT 39.8 37.0  PLT 267 252    BMET  Recent Labs  06/23/15 0455 06/24/15 0425  NA 140 140  K 3.5 3.6  CL 99* 105  CO2 31 27  GLUCOSE 116* 98  BUN 14 14  CREATININE 1.17* 1.07*  CALCIUM 8.8* 8.6*   PT/INR No results for input(s): LABPROT, INR in the last 72 hours.  No results for input(s): AST, ALT, ALKPHOS, BILITOT, PROT, ALBUMIN in the last 168 hours.   Lipase  No results found for: LIPASE   Studies/Results: Dg Abd 1 View  06/25/2015  CLINICAL DATA:  Mid abdominal pain and constipation; history of small-bowel obstruction. Patient is passing only liquid stools. EXAM: ABDOMEN - 1 VIEW COMPARISON:  Abdominal series of June 24, 2015 FINDINGS: The nasogastric tube tip lies in the distal pylorus  or proximal duodenum. There is small amount of gas within the stomach. There remain loops of moderately distended gas-filled small bowel in the midline. The descending colon contains a small amount of formed stool. There is stool and gas in the rectum. The bony structures exhibit no acute abnormalities. There are phleboliths within the pelvis. IMPRESSION: Stable appearing partial mid to distal small bowel obstruction. There is no evidence of perforation. Electronically Signed   By: David  [place] M.D.   On: 06/25/2015 09:56   Dg Abd 1 View  06/24/2015  CLINICAL DATA:  Follow-up small bowel obstruction EXAM: ABDOMEN - 1 VIEW COMPARISON:  06/22/2015 FINDINGS: Stable NG tube. Loops of dilated left upper quadrant small bowel again identified. Dilatation is slightly less, decreasing from a maximum diameter of 42 mm previously to 36 mm currently. There are few or dilated loops. There is progression of some gas into distal small bowel and proximal colon. IMPRESSION: Persistent but mildly improved small-bowel obstruction findings Electronically Signed   By: Esperanza Heir M.D.   On: 06/24/2015 10:21    Medications: . antiseptic oral rinse  7 mL Mouth Rinse q12n4p  . chlorhexidine  15 mL Mouth Rinse BID  . enoxaparin (LOVENOX) injection  40 mg Subcutaneous Q24H  . pantoprazole (PROTONIX) IV  40 mg Intravenous QHS    Assessment/Plan SBO S/p laparoscopy, lysis of adhesions, total abdominal hysterectomy, cystoscopy, 06/18/07, [name], M.D. Constipation Antibiotics: None  DVT: Lovenox/SCD  Ongoing SB obstruction, I pulled the NG back some.  Will review with Dr. Gerrit FriendsGerkin.  Her I/O is negative, I will recheck BMP also, I have increased her fluids to 150 ml/hr.     LOS: 3 days    Ashia Dehner 06/25/2015

## 2015-06-25 NOTE — Progress Notes (Signed)
06/25/15 1839: Pt c/o pain in abdomen and feared it was because she drank too much today. NG connected to suction and 600 ml returned. NG reclamped. Advised pt to go slow with her po intake; she is in agreement.  Lina SarBeth Tremar Wickens, RN

## 2015-06-26 ENCOUNTER — Inpatient Hospital Stay (HOSPITAL_COMMUNITY): Payer: 59

## 2015-06-26 ENCOUNTER — Inpatient Hospital Stay (HOSPITAL_COMMUNITY): Payer: 59 | Admitting: Certified Registered Nurse Anesthetist

## 2015-06-26 ENCOUNTER — Encounter (HOSPITAL_COMMUNITY): Admission: EM | Disposition: A | Discharge status: Home / Self Care | Payer: Self-pay | Source: Home / Self Care

## 2015-06-26 ENCOUNTER — Encounter (HOSPITAL_COMMUNITY): Payer: Self-pay | Admitting: Certified Registered Nurse Anesthetist

## 2015-06-26 DIAGNOSIS — K219 Gastro-esophageal reflux disease without esophagitis: Secondary | ICD-10-CM | POA: Diagnosis present

## 2015-06-26 DIAGNOSIS — K56609 Unspecified intestinal obstruction, unspecified as to partial versus complete obstruction: Secondary | ICD-10-CM | POA: Diagnosis present

## 2015-06-26 HISTORY — PX: LAPAROTOMY: SHX154

## 2015-06-26 LAB — BASIC METABOLIC PANEL
ANION GAP: 11 (ref 5–15)
BUN: 12 mg/dL (ref 6–20)
CHLORIDE: 103 mmol/L (ref 101–111)
CO2: 25 mmol/L (ref 22–32)
CREATININE: 0.84 mg/dL (ref 0.44–1.00)
Calcium: 9.1 mg/dL (ref 8.9–10.3)
GFR calc non Af Amer: >60 mL/min (ref >60)
Glucose, Bld: 118 mg/dL — ABNORMAL HIGH (ref 65–99)
Potassium: 3.4 mmol/L — ABNORMAL LOW (ref 3.5–5.1)
SODIUM: 139 mmol/L (ref 135–145)

## 2015-06-26 LAB — CBC
HEMATOCRIT: 36 % (ref 36.0–46.0)
HEMOGLOBIN: 11.7 g/dL — AB (ref 12.0–15.0)
MCH: 26.5 pg (ref 26.0–34.0)
MCHC: 32.5 g/dL (ref 30.0–36.0)
MCV: 81.6 fL (ref 78.0–100.0)
Platelets: 280 10*3/uL (ref 150–400)
RBC: 4.41 MIL/uL (ref 3.87–5.11)
RDW: 13.3 % (ref 11.5–15.5)
WBC: 8.4 10*3/uL (ref 4.0–10.5)

## 2015-06-26 LAB — SURGICAL PCR SCREEN
MRSA, PCR: NEGATIVE
Staphylococcus aureus: POSITIVE — AB

## 2015-06-26 SURGERY — LAPAROTOMY, EXPLORATORY
Anesthesia: General | Site: Abdomen

## 2015-06-26 MED ORDER — PROPOFOL 10 MG/ML IV BOLUS
INTRAVENOUS | Status: AC
  Filled 2015-06-26: qty 20

## 2015-06-26 MED ORDER — SUGAMMADEX SODIUM 500 MG/5ML IV SOLN
INTRAVENOUS | Status: AC
  Filled 2015-06-26: qty 5

## 2015-06-26 MED ORDER — PROMETHAZINE HCL 25 MG/ML IJ SOLN: 6.2500 mg | INTRAMUSCULAR | Status: DC | PRN

## 2015-06-26 MED ORDER — CEFAZOLIN SODIUM-DEXTROSE 2-4 GM/100ML-% IV SOLN
2.0000 g | INTRAVENOUS | Status: AC
Start: 2015-06-27 — End: 2015-06-26
  Administered 2015-06-26: 2 g via INTRAVENOUS

## 2015-06-26 MED ORDER — ONDANSETRON 4 MG PO TBDP: 4.0000 mg | ORAL_TABLET | Freq: Four times a day (QID) | ORAL | Status: DC | PRN

## 2015-06-26 MED ORDER — FENTANYL CITRATE (PF) 100 MCG/2ML IJ SOLN
INTRAMUSCULAR | Status: AC
  Filled 2015-06-26: qty 2

## 2015-06-26 MED ORDER — MORPHINE SULFATE 2 MG/ML IV SOLN
INTRAVENOUS | Status: DC
  Administered 2015-06-27: 19.2 mg via INTRAVENOUS
  Administered 2015-06-27: 16.5 mg via INTRAVENOUS
  Administered 2015-06-27: 10.41 mg via INTRAVENOUS
  Administered 2015-06-27: 25 mg via INTRAVENOUS
  Administered 2015-06-28: 12.5 mg via INTRAVENOUS
  Administered 2015-06-28: 7.5 mg via INTRAVENOUS
  Administered 2015-06-28: 07:00:00 via INTRAVENOUS
  Administered 2015-06-28: 1.5 mg via INTRAVENOUS
  Administered 2015-06-28: 12 mg via INTRAVENOUS
  Administered 2015-06-28: 6 mg via INTRAVENOUS
  Administered 2015-06-29: 15 mg via INTRAVENOUS
  Administered 2015-06-29: 4.5 mg via INTRAVENOUS
  Administered 2015-06-29: 16.5 mg via INTRAVENOUS
  Administered 2015-06-29: 4.5 mg via INTRAVENOUS
  Administered 2015-06-29: 12.1 mg via INTRAVENOUS
  Administered 2015-06-29: via INTRAVENOUS
  Administered 2015-06-30: 12 mg via INTRAVENOUS
  Administered 2015-06-30: 1.5 mg via INTRAVENOUS
  Administered 2015-06-30: 13.5 mg via INTRAVENOUS
  Administered 2015-06-30: 13.3 mg via INTRAVENOUS
  Administered 2015-06-30: 17:00:00 via INTRAVENOUS
  Administered 2015-07-01: 10.5 mg via INTRAVENOUS
  Administered 2015-07-01: 9 mg via INTRAVENOUS
  Administered 2015-07-01: 6 mg via INTRAVENOUS
  Administered 2015-07-01: 4.5 mg via INTRAVENOUS
  Administered 2015-07-01: 14:00:00 via INTRAVENOUS
  Administered 2015-07-01 (×2): 6 mg via INTRAVENOUS
  Administered 2015-07-02: 1.8 mg via INTRAVENOUS
  Administered 2015-07-02: 1.5 mg via INTRAVENOUS
  Administered 2015-07-02 – 2015-07-03 (×2): 3 mg via INTRAVENOUS
  Filled 2015-06-26 (×8): qty 25

## 2015-06-26 MED ORDER — PANTOPRAZOLE SODIUM 40 MG IV SOLR
40.0000 mg | Freq: Every day | INTRAVENOUS | Status: DC
  Administered 2015-06-26 – 2015-07-02 (×7): 40 mg via INTRAVENOUS
  Filled 2015-06-26 (×8): qty 40

## 2015-06-26 MED ORDER — ONDANSETRON HCL 4 MG/2ML IJ SOLN: 4.0000 mg | Freq: Four times a day (QID) | INTRAMUSCULAR | Status: DC | PRN

## 2015-06-26 MED ORDER — FENTANYL CITRATE (PF) 250 MCG/5ML IJ SOLN
INTRAMUSCULAR | Status: AC
Start: 2015-06-26 — End: 2015-06-26
  Filled 2015-06-26: qty 5

## 2015-06-26 MED ORDER — ACETAMINOPHEN 10 MG/ML IV SOLN
INTRAVENOUS | Status: AC
  Filled 2015-06-26: qty 100

## 2015-06-26 MED ORDER — PROPOFOL 10 MG/ML IV BOLUS
INTRAVENOUS | Status: DC | PRN
Start: 2015-06-26 — End: 2015-06-26
  Administered 2015-06-26: 200 mg via INTRAVENOUS

## 2015-06-26 MED ORDER — SCOPOLAMINE 1 MG/3DAYS TD PT72
1.0000 | MEDICATED_PATCH | Freq: Once | TRANSDERMAL | Status: DC
  Administered 2015-06-26: 1.5 mg via TRANSDERMAL

## 2015-06-26 MED ORDER — MEPERIDINE HCL 50 MG/ML IJ SOLN: 6.2500 mg | INTRAMUSCULAR | Status: DC | PRN

## 2015-06-26 MED ORDER — LACTATED RINGERS IV SOLN
INTRAVENOUS | Status: DC | PRN
  Administered 2015-06-26 (×3): via INTRAVENOUS

## 2015-06-26 MED ORDER — HYDROMORPHONE HCL 1 MG/ML IJ SOLN
INTRAMUSCULAR | Status: AC
  Filled 2015-06-26: qty 1

## 2015-06-26 MED ORDER — ACETAMINOPHEN 650 MG RE SUPP: 650.0000 mg | Freq: Four times a day (QID) | RECTAL | Status: DC | PRN

## 2015-06-26 MED ORDER — MIDAZOLAM HCL 2 MG/2ML IJ SOLN: 0.5000 mg | Freq: Once | INTRAMUSCULAR | Status: DC | PRN

## 2015-06-26 MED ORDER — SUCCINYLCHOLINE CHLORIDE 20 MG/ML IJ SOLN
INTRAMUSCULAR | Status: DC | PRN
  Administered 2015-06-26: 100 mg via INTRAVENOUS

## 2015-06-26 MED ORDER — ESMOLOL HCL 100 MG/10ML IV SOLN
INTRAVENOUS | Status: DC | PRN
  Administered 2015-06-26: 10 mg via INTRAVENOUS
  Administered 2015-06-26 (×2): 5 mg via INTRAVENOUS
  Administered 2015-06-26 (×2): 10 mg via INTRAVENOUS

## 2015-06-26 MED ORDER — LIDOCAINE HCL (CARDIAC) 20 MG/ML IV SOLN
INTRAVENOUS | Status: AC
  Filled 2015-06-26: qty 5

## 2015-06-26 MED ORDER — NALOXONE HCL 0.4 MG/ML IJ SOLN: 0.4000 mg | INTRAMUSCULAR | Status: DC | PRN

## 2015-06-26 MED ORDER — SODIUM CHLORIDE 0.9% FLUSH: 9.0000 mL | INTRAVENOUS | Status: DC | PRN

## 2015-06-26 MED ORDER — LIDOCAINE HCL (CARDIAC) 20 MG/ML IV SOLN
INTRAVENOUS | Status: DC | PRN
  Administered 2015-06-26: 60 mg via INTRAVENOUS

## 2015-06-26 MED ORDER — MIDAZOLAM HCL 5 MG/5ML IJ SOLN
INTRAMUSCULAR | Status: DC | PRN
  Administered 2015-06-26: 2 mg via INTRAVENOUS

## 2015-06-26 MED ORDER — ENOXAPARIN SODIUM 40 MG/0.4ML ~~LOC~~ SOLN
40.0000 mg | SUBCUTANEOUS | Status: DC
  Administered 2015-06-27 – 2015-07-02 (×6): 40 mg via SUBCUTANEOUS
  Filled 2015-06-26 (×7): qty 0.4

## 2015-06-26 MED ORDER — SODIUM CHLORIDE 0.9 % IR SOLN
Status: DC | PRN
  Administered 2015-06-26: 2000 mL

## 2015-06-26 MED ORDER — LACTATED RINGERS IV SOLN: INTRAVENOUS | Status: DC

## 2015-06-26 MED ORDER — FENTANYL CITRATE (PF) 100 MCG/2ML IJ SOLN
INTRAMUSCULAR | Status: DC | PRN
  Administered 2015-06-26: 50 ug via INTRAVENOUS
  Administered 2015-06-26 (×3): 100 ug via INTRAVENOUS
  Administered 2015-06-26 (×2): 50 ug via INTRAVENOUS

## 2015-06-26 MED ORDER — ACETAMINOPHEN 325 MG PO TABS: 650.0000 mg | ORAL_TABLET | Freq: Four times a day (QID) | ORAL | Status: DC | PRN

## 2015-06-26 MED ORDER — DIPHENHYDRAMINE HCL 12.5 MG/5ML PO ELIX: 12.5000 mg | ORAL_SOLUTION | Freq: Four times a day (QID) | ORAL | Status: DC | PRN

## 2015-06-26 MED ORDER — KCL IN DEXTROSE-NACL 20-5-0.45 MEQ/L-%-% IV SOLN
INTRAVENOUS | Status: DC
  Administered 2015-06-26 – 2015-06-28 (×3): via INTRAVENOUS
  Administered 2015-06-29 – 2015-07-01 (×5): 125 mL/h via INTRAVENOUS
  Administered 2015-07-01 – 2015-07-02 (×4): via INTRAVENOUS
  Filled 2015-06-26 (×22): qty 1000

## 2015-06-26 MED ORDER — SCOPOLAMINE 1 MG/3DAYS TD PT72
MEDICATED_PATCH | TRANSDERMAL | Status: AC
  Filled 2015-06-26: qty 1

## 2015-06-26 MED ORDER — ARTIFICIAL TEARS OP OINT
TOPICAL_OINTMENT | OPHTHALMIC | Status: AC
  Filled 2015-06-26: qty 3.5

## 2015-06-26 MED ORDER — CEFAZOLIN SODIUM-DEXTROSE 2-3 GM-% IV SOLR
INTRAVENOUS | Status: AC
  Filled 2015-06-26: qty 50

## 2015-06-26 MED ORDER — MIDAZOLAM HCL 2 MG/2ML IJ SOLN
INTRAMUSCULAR | Status: AC
  Filled 2015-06-26: qty 2

## 2015-06-26 MED ORDER — ACETAMINOPHEN 10 MG/ML IV SOLN
1000.0000 mg | Freq: Once | INTRAVENOUS | Status: AC
  Administered 2015-06-26: 1000 mg via INTRAVENOUS

## 2015-06-26 MED ORDER — SUGAMMADEX SODIUM 200 MG/2ML IV SOLN
INTRAVENOUS | Status: DC | PRN
  Administered 2015-06-26: 500 mg via INTRAVENOUS

## 2015-06-26 MED ORDER — LACTATED RINGERS IV SOLN
INTRAVENOUS | Status: DC
  Administered 2015-06-26: 1000 mL via INTRAVENOUS

## 2015-06-26 MED ORDER — HYDROMORPHONE HCL 1 MG/ML IJ SOLN
0.2500 mg | INTRAMUSCULAR | Status: DC | PRN
  Administered 2015-06-26 (×4): 0.5 mg via INTRAVENOUS

## 2015-06-26 MED ORDER — ROCURONIUM BROMIDE 100 MG/10ML IV SOLN
INTRAVENOUS | Status: DC | PRN
  Administered 2015-06-26: 10 mg via INTRAVENOUS
  Administered 2015-06-26: 40 mg via INTRAVENOUS

## 2015-06-26 MED ORDER — DIPHENHYDRAMINE HCL 50 MG/ML IJ SOLN: 12.5000 mg | Freq: Four times a day (QID) | INTRAMUSCULAR | Status: DC | PRN

## 2015-06-26 MED ORDER — ROCURONIUM BROMIDE 100 MG/10ML IV SOLN
INTRAVENOUS | Status: AC
  Filled 2015-06-26: qty 1

## 2015-06-26 SURGICAL SUPPLY — 36 items
APPLICATOR COTTON TIP 6IN STRL (MISCELLANEOUS) ×2 IMPLANT
BLADE EXTENDED COATED 6.5IN (ELECTRODE) IMPLANT
BLADE HEX COATED 2.75 (ELECTRODE) ×2 IMPLANT
COVER MAYO STAND STRL (DRAPES) IMPLANT
COVER SURGICAL LIGHT HANDLE (MISCELLANEOUS) ×2 IMPLANT
DRAPE LAPAROSCOPIC ABDOMINAL (DRAPES) ×2 IMPLANT
DRAPE WARM FLUID 44X44 (DRAPE) ×2 IMPLANT
ELECT REM PT RETURN 9FT ADLT (ELECTROSURGICAL) ×2
ELECTRODE REM PT RTRN 9FT ADLT (ELECTROSURGICAL) ×1 IMPLANT
GAUZE SPONGE 4X4 12PLY STRL (GAUZE/BANDAGES/DRESSINGS) ×2 IMPLANT
GLOVE BIOGEL PI IND STRL 7.0 (GLOVE) ×1 IMPLANT
GLOVE BIOGEL PI INDICATOR 7.0 (GLOVE) ×1
GLOVE SURG ORTHO 8.0 STRL STRW (GLOVE) ×2 IMPLANT
GOWN STRL REUS W/TWL LRG LVL3 (GOWN DISPOSABLE) ×2 IMPLANT
GOWN STRL REUS W/TWL XL LVL3 (GOWN DISPOSABLE) ×4 IMPLANT
HANDLE SUCTION POOLE (INSTRUMENTS) IMPLANT
KIT BASIN OR (CUSTOM PROCEDURE TRAY) ×2 IMPLANT
NS IRRIG 1000ML POUR BTL (IV SOLUTION) ×2 IMPLANT
PACK GENERAL/GYN (CUSTOM PROCEDURE TRAY) ×2 IMPLANT
SPONGE LAP 18X18 X RAY DECT (DISPOSABLE) ×2 IMPLANT
STAPLER VISISTAT 35W (STAPLE) ×2 IMPLANT
SUCTION POOLE HANDLE (INSTRUMENTS)
SUT NOVA NAB DX-16 0-1 5-0 T12 (SUTURE) ×8 IMPLANT
SUT SILK 2 0 (SUTURE) ×1
SUT SILK 2 0 SH CR/8 (SUTURE) ×2 IMPLANT
SUT SILK 2-0 18XBRD TIE 12 (SUTURE) ×1 IMPLANT
SUT SILK 3 0 (SUTURE) ×1
SUT SILK 3 0 SH CR/8 (SUTURE) ×2 IMPLANT
SUT SILK 3-0 18XBRD TIE 12 (SUTURE) ×1 IMPLANT
SUT VICRYL 2 0 18  UND BR (SUTURE)
SUT VICRYL 2 0 18 UND BR (SUTURE) IMPLANT
TOWEL OR 17X26 10 PK STRL BLUE (TOWEL DISPOSABLE) ×4 IMPLANT
TRAY FOLEY W/METER SILVER 14FR (SET/KITS/TRAYS/PACK) ×2 IMPLANT
TRAY FOLEY W/METER SILVER 16FR (SET/KITS/TRAYS/PACK) IMPLANT
WATER STERILE IRR 1500ML POUR (IV SOLUTION) ×2 IMPLANT
YANKAUER SUCT BULB TIP NO VENT (SUCTIONS) ×2 IMPLANT

## 2015-06-26 NOTE — Anesthesia Postprocedure Evaluation (Signed)
Anesthesia Post Note  Patient: Harrel CarinaShannon D Penkala  Procedure(s) Performed: Procedure(s) (LRB): EXPLORATORY LAPAROTOMY, LYSIS OF ADHESIONS (N/A)  Patient location during evaluation: PACU Anesthesia Type: General Level of consciousness: awake and alert, oriented and patient cooperative Pain management: pain level controlled Vital Signs Assessment: post-procedure vital signs reviewed and stable Respiratory status: spontaneous breathing, nonlabored ventilation, respiratory function stable and patient connected to nasal cannula oxygen Cardiovascular status: blood pressure returned to baseline and stable Postop Assessment: no signs of nausea or vomiting Anesthetic complications: no    Last Vitals:  Filed Vitals:   06/26/15 1615 06/26/15 1626  BP: 148/89 147/91  Pulse: 109 105  Temp:  37 C  Resp: 15 16    Last Pain:  Filed Vitals:   06/26/15 1628  PainSc: 6                  Avianah Pellman,E. Cristin Szatkowski

## 2015-06-26 NOTE — Transfer of Care (Signed)
Immediate Anesthesia Transfer of Care Note  Patient: [name]  Procedure(s) Performed: Procedure(s): EXPLORATORY LAPAROTOMY, LYSIS OF ADHESIONS (N/A)  Patient Location: PACU  Anesthesia Type:General  Level of Consciousness:  sedated, patient cooperative and responds to stimulation  Airway & Oxygen Therapy:Patient Spontanous Breathing and Patient connected to face mask oxgen  Post-op Assessment:  Report given to PACU RN and Post -op Vital signs reviewed and stable  Post vital signs:  Reviewed and stable  Last Vitals:  Filed Vitals:   06/25/15 2103 06/26/15 0520  BP: 142/84 131/86  Pulse: 129 116  Temp: 38.3 C 37.6 C  Resp: 16 16    Complications: No apparent anesthesia complications

## 2015-06-26 NOTE — Progress Notes (Signed)
Subjective: She felt bad after clamping and got 600 out.  Was placed back on NG clamping and told to go slower after that with fluids.  This AM she does feel tight and tender over the stomach.  No flatus and only 1 BM she said about 1 hours after her suppository.    Objective: Vital signs in last 24 hours: Temp:  [99.6 F (37.6 C)-100.9 F (38.3 C)] 99.6 F (37.6 C) (03/24 0520) Pulse Rate:  [116-129] 116 (03/24 0520) Resp:  [16] 16 (03/24 0520) BP: (131-155)/(84-86) 131/86 mmHg (03/24 0520) SpO2:  [97 %-100 %] 98 % (03/24 0520) Last BM Date: 06/25/15 (results of a suppository that was given.) 640 PO NG 600  Diet: clears BM x 1 TM 100.9 K+ 3.4 Wbc is normal     Intake/Output from previous day: 03/23 0701 - 03/24 0700 In: 2171.3 [P.O.:640; I.V.:1501.3; NG/GT:30] Out: 1900 [Urine:1300; Emesis/NG output:600] Intake/Output this shift:    General appearance: alert, cooperative and no distress Resp: clear to auscultation bilaterally GI: soft, still a little tender over her stomach.  BS very hypoactive.  Lab Results:   Recent Labs  06/24/15 0425 06/26/15 0424  WBC 7.7 8.4  HGB 11.5* 11.7*  HCT 37.0 36.0  PLT 252 280    BMET  Recent Labs  06/25/15 1117 06/26/15 0424  NA 143 139  K 3.9 3.4*  CL 105 103  CO2 26 25  GLUCOSE 92 118*  BUN 16 12  CREATININE 0.92 0.84  CALCIUM 8.9 9.1   PT/INR No results for input(s): LABPROT, INR in the last 72 hours.  No results for input(s): AST, ALT, ALKPHOS, BILITOT, PROT, ALBUMIN in the last 168 hours.   Lipase  No results found for: LIPASE   Studies/Results: Dg Abd 1 View  06/25/2015  CLINICAL DATA:  Mid abdominal pain and constipation; history of small-bowel obstruction. Patient is passing only liquid stools. EXAM: ABDOMEN - 1 VIEW COMPARISON:  Abdominal series of June 24, 2015 FINDINGS: The nasogastric tube tip lies in the distal pylorus or proximal duodenum. There is small amount of gas within the stomach. There  remain loops of moderately distended gas-filled small bowel in the midline. The descending colon contains a small amount of formed stool. There is stool and gas in the rectum. The bony structures exhibit no acute abnormalities. There are phleboliths within the pelvis. IMPRESSION: Stable appearing partial mid to distal small bowel obstruction. There is no evidence of perforation. Electronically Signed   By: David  SwazilandJordan M.D.   On: 06/25/2015 09:56   Dg Abd 1 View  06/24/2015  CLINICAL DATA:  Follow-up small bowel obstruction EXAM: ABDOMEN - 1 VIEW COMPARISON:  06/22/2015 FINDINGS: Stable NG tube. Loops of dilated left upper quadrant small bowel again identified. Dilatation is slightly less, decreasing from a maximum diameter of 42 mm previously to 36 mm currently. There are few or dilated loops. There is progression of some gas into distal small bowel and proximal colon. IMPRESSION: Persistent but mildly improved small-bowel obstruction findings Electronically Signed   By: Esperanza Heiraymond  Rubner M.D.   On: 06/24/2015 10:21    Medications: . antiseptic oral rinse  7 mL Mouth Rinse q12n4p  . chlorhexidine  15 mL Mouth Rinse BID  . enoxaparin (LOVENOX) injection  40 mg Subcutaneous Q24H  . pantoprazole (PROTONIX) IV  40 mg Intravenous QHS    Assessment/Plan SBO S/p laparoscopy, lysis of adhesions, total abdominal hysterectomy, cystoscopy, 06/18/07, [name], M.D. Constipation Antibiotics: None DVT: Lovenox/SCD  Plan:  I am going to hold her fluids and hook up her NG see how much comes out.  Send down for another film.  I went back after the NG was connected to suction.  She got 645 right away and she has more in there now.  I have resumed suction and made her NPO.       LOS: 4 days    [name] 06/26/2015 [phone]

## 2015-06-26 NOTE — Brief Op Note (Signed)
06/22/2015 - 06/26/2015  3:20 PM  PATIENT:  [name]  40 y.o. female  PRE-OPERATIVE DIAGNOSIS:  small bowel obstruction  POST-OPERATIVE DIAGNOSIS:  small bowel obstruction  PROCEDURE:  Procedure(s): EXPLORATORY LAPAROTOMY, LYSIS OF ADHESIONS (N/A)  SURGEON:  Surgeon(s) and Role:    * Darnell Levelodd Satori Krabill, MD - Primary  ANESTHESIA:   general  EBL:  Total I/O In: 2570 [P.O.:240; I.V.:2300; NG/GT:30] Out: 1275 [Urine:250; Emesis/NG output:1000; Blood:25]  BLOOD ADMINISTERED:none  DRAINS: none   LOCAL MEDICATIONS USED:  NONE  SPECIMEN:  No Specimen  DISPOSITION OF SPECIMEN:  N/A  COUNTS:  YES  TOURNIQUET:  * No tourniquets in log *  DICTATION: .Other Dictation: Dictation Number [id number]  PLAN OF CARE: Admit to inpatient   PATIENT DISPOSITION:  PACU - hemodynamically stable.   Delay start of Pharmacological VTE agent (>24hrs) due to surgical blood loss or risk of bleeding: yes  Velora Hecklerodd M. Mitchell Iwanicki, MD, [facility] Office: [phone]

## 2015-06-26 NOTE — Op Note (Signed)
NAMGillermo Short:  [name], [name]              ACCOUNT NO.:  [id number]  MEDICAL RECORD NO.:  [id number]  LOCATION:  [facility]                         [facility]  PHYSICIAN:  Velora Hecklerodd M Dajohn Ellender, MD      DATE OF BIRTH:  08-03-1975  DATE OF PROCEDURE:  06/26/2015                              OPERATIVE REPORT   PREOPERATIVE DIAGNOSIS:  Small bowel obstruction.  POSTOPERATIVE DIAGNOSIS:  Small bowel obstruction secondary to adhesions.  PROCEDURE:  Exploratory laparotomy with lysis of adhesions.  SURGEON:  Velora Hecklerodd M Macai Sisneros, MD.  ANESTHESIA:  General per Dr. Jairo Benarswell Jackson.  ESTIMATED BLOOD LOSS:  Minimal.  PREPARATION:  ChloraPrep.  COMPLICATIONS:  None.  INDICATIONS:  The patient is a 40 year old female with a history of cesarean section and hysterectomy.  She was admitted with small-bowel obstruction.  The patient failed management with nasogastric decompression and intravenous hydration.  The patient now comes to the operating room for laparotomy for lysis of adhesions.  BODY OF REPORT:  [facility]  The patient was brought to the operating room, placed in the supine position on the operating room table.  Following administration of general anesthesia, the patient was positioned and then prepped and draped in the usual aseptic fashion.  After ascertaining that an adequate level of anesthesia had been obtained, a lower midline abdominal incision was made with a #10 blade.  Dissection was carried through adipose tissue to the fascia.  Fascia was incised in the midline and the peritoneal cavity was entered cautiously.  Palpation reveals multiple adhesions in the pelvis.  Incision was extended cephalad around the umbilicus in the midline.  Balfour retractors placed for exposure.  Adhesions in the pelvis mainly involved the mesentery and the omentum, and were lysed with the electrocautery.  This allowed for complete mobilization of the  small bowel.  There was an obvious transition point in the mid ileum where there was a band across the bowel causing a significant narrowing and there was a transition point from markedly dilated to relatively normal caliber small bowel at this point.  The bowel appears viable.  The bowel was run from the ligament of Treitz to the ileocecal valve and no further areas of obstruction were identified.  Bowel was returned to the peritoneal cavity.  Midline fascia was closed with interrupted #1 Novafil simple sutures.  Subcutaneous tissues were irrigated.  Skin was closed with stainless steel staples.  Sterile dressings were applied.  The patient was awakened from anesthesia and brought to the recovery room.  The patient tolerated the procedure well.   Velora Hecklerodd M. Arlen Dupuis, MD, [facility] Office: [phone]    TMG/MEDQ  D:  06/26/2015  T:  06/26/2015  Job:  [id number]

## 2015-06-26 NOTE — Anesthesia Procedure Notes (Signed)
Procedure Name: Intubation Date/Time: 06/26/2015 2:07 PM Performed by: Wynonia SoursWALKER, Nicole Barnier L Pre-anesthesia Checklist: Patient identified, Emergency Drugs available, Suction available, Patient being monitored and Timeout performed Patient Re-evaluated:Patient Re-evaluated prior to inductionOxygen Delivery Method: Circle system utilized Preoxygenation: Pre-oxygenation with 100% oxygen Intubation Type: IV induction, Rapid sequence and Cricoid Pressure applied Laryngoscope Size: Mac and 4 Grade View: Grade I Tube type: Oral Tube size: 7.0 mm Number of attempts: 1 Airway Equipment and Method: Stylet Placement Confirmation: ETT inserted through vocal cords under direct vision,  positive ETCO2,  CO2 detector and breath sounds checked- equal and bilateral Secured at: 21 cm Tube secured with: Tape Dental Injury: Teeth and Oropharynx as per pre-operative assessment

## 2015-06-26 NOTE — Anesthesia Preprocedure Evaluation (Addendum)
Anesthesia Evaluation  Patient identified by MRN, date of birth, ID band Patient awake    Reviewed: Allergy & Precautions, NPO status , Patient's Chart, lab work & pertinent test results  History of Anesthesia Complications Negative for: history of anesthetic complications  Airway Mallampati: I  TM Distance: >3 FB Neck ROM: Full    Dental  (+) Dental Advisory Given   Pulmonary neg pulmonary ROS,    breath sounds clear to auscultation       Cardiovascular (-) anginanegative cardio ROS   Rhythm:Regular Rate:Normal     Neuro/Psych negative neurological ROS     GI/Hepatic Neg liver ROS, GERD  Medicated,N/v with small bowel obstruction   Endo/Other  Morbid obesity  Renal/GU negative Renal ROS     Musculoskeletal   Abdominal (+) + obese,   Peds  Hematology negative hematology ROS (+)   Anesthesia Other Findings   Reproductive/Obstetrics                           Anesthesia Physical Anesthesia Plan  ASA: II  Anesthesia Plan: General   Post-op Pain Management:    Induction: Intravenous and Rapid sequence  Airway Management Planned: Oral ETT  Additional Equipment:   Intra-op Plan:   Post-operative Plan: Extubation in OR  Informed Consent: I have reviewed the patients History and Physical, chart, labs and discussed the procedure including the risks, benefits and alternatives for the proposed anesthesia with the patient or authorized representative who has indicated his/her understanding and acceptance.   Dental advisory given  Plan Discussed with: CRNA and Surgeon  Anesthesia Plan Comments: (Plan routine monitors, GETA with RSI)        Anesthesia Quick Evaluation

## 2015-06-27 LAB — CBC
HCT: 36.3 % (ref 36.0–46.0)
HEMOGLOBIN: 11.6 g/dL — AB (ref 12.0–15.0)
MCH: 26.3 pg (ref 26.0–34.0)
MCHC: 32 g/dL (ref 30.0–36.0)
MCV: 82.3 fL (ref 78.0–100.0)
PLATELETS: 287 10*3/uL (ref 150–400)
RBC: 4.41 MIL/uL (ref 3.87–5.11)
RDW: 13.3 % (ref 11.5–15.5)
WBC: 8.6 10*3/uL (ref 4.0–10.5)

## 2015-06-27 LAB — BASIC METABOLIC PANEL
ANION GAP: 10 (ref 5–15)
BUN: 8 mg/dL (ref 6–20)
CALCIUM: 8.6 mg/dL — AB (ref 8.9–10.3)
CHLORIDE: 101 mmol/L (ref 101–111)
CO2: 29 mmol/L (ref 22–32)
CREATININE: 1.04 mg/dL — AB (ref 0.44–1.00)
GFR calc non Af Amer: >60 mL/min (ref >60)
Glucose, Bld: 119 mg/dL — ABNORMAL HIGH (ref 65–99)
Potassium: 3.4 mmol/L — ABNORMAL LOW (ref 3.5–5.1)
SODIUM: 140 mmol/L (ref 135–145)

## 2015-06-27 MED ORDER — SODIUM CHLORIDE 0.9 % IV BOLUS (SEPSIS)
1000.0000 mL | Freq: Once | INTRAVENOUS | Status: AC
  Administered 2015-06-27: 1000 mL via INTRAVENOUS

## 2015-06-27 MED ORDER — LABETALOL HCL 5 MG/ML IV SOLN
2.5000 mg | Freq: Once | INTRAVENOUS | Status: AC
  Administered 2015-06-27: 2.5 mg via INTRAVENOUS
  Filled 2015-06-27: qty 4

## 2015-06-27 NOTE — Significant Event (Signed)
Rapid Response Event Note  Overview: Time Called: 1700 Arrival Time: 1705 Event Type: Cardiac  Initial Focused Assessment: Pt sitting on side of bed,said she was going to walk but was told not to because of heart rate. Pt is in no distress, skin W&D.  No c/o chest pain or SOB.     Interventions: Placed on monitor with ST rate 150's , BP 132/88 P 153 R 17 O2 sat 96%.   1L bolus of NS given per order.  Pt assisted to lie back down.     Event Summary:   at   1730  HR 141,  BP 114/81, pt appears comfortable, denies pain.  Will cont to monitor HR via 02 sat monitor.    at          LemooreDillon, Carlie Corpus S

## 2015-06-27 NOTE — Progress Notes (Signed)
Patient ID: [name], female   DOB: 1975-05-14, 40 y.o.   MRN: [id number]  General Surgery [facility]  Subjective: Patient in bed, smiling, pain controlled by PCA.  Objective: Vital signs in last 24 hours: Temp:  [97.7 F (36.5 C)-98.7 F (37.1 C)] 98.2 F (36.8 C) (03/25 0630) Pulse Rate:  [102-120] 120 (03/25 0630) Resp:  [12-22] 12 (03/25 0630) BP: (123-152)/(56-97) 130/84 mmHg (03/25 0630) SpO2:  [99 %-100 %] 100 % (03/25 0630) Last BM Date: 06/25/15  Intake/Output from previous day: 03/24 0701 - 03/25 0700 In: 3970 [P.O.:240; I.V.:3600; NG/GT:30; IV Piggyback:100] Out: 3325 [Urine:970; Emesis/NG output:2330; Blood:25] Intake/Output this shift:    Physical Exam: HEENT - sclerae clear, mucous membranes moist Neck - soft Abdomen - soft, binder in place, dry; NG with bilious Ext - no edema, non-tender Neuro - alert & oriented, no focal deficits  Lab Results:   Recent Labs  06/26/15 0424 06/27/15 0426  WBC 8.4 8.6  HGB 11.7* 11.6*  HCT 36.0 36.3  PLT 280 287   BMET  Recent Labs  06/26/15 0424 06/27/15 0426  NA 139 140  K 3.4* 3.4*  CL 103 101  CO2 25 29  GLUCOSE 118* 119*  BUN 12 8  CREATININE 0.84 1.04*  CALCIUM 9.1 8.6*   PT/INR No results for input(s): LABPROT, INR in the last 72 hours. Comprehensive Metabolic Panel:    Component Value Date/Time   NA 140 06/27/2015 0426   NA 139 06/26/2015 0424   K 3.4* 06/27/2015 0426   K 3.4* 06/26/2015 0424   CL 101 06/27/2015 0426   CL 103 06/26/2015 0424   CO2 29 06/27/2015 0426   CO2 25 06/26/2015 0424   BUN 8 06/27/2015 0426   BUN 12 06/26/2015 0424   CREATININE 1.04* 06/27/2015 0426   CREATININE 0.84 06/26/2015 0424   GLUCOSE 119* 06/27/2015 0426   GLUCOSE 118* 06/26/2015 0424   CALCIUM 8.6* 06/27/2015 0426   CALCIUM 9.1 06/26/2015 0424    Studies/Results: Dg Abd 1 View  06/25/2015  CLINICAL DATA:  Mid abdominal pain and constipation; history of small-bowel  obstruction. Patient is passing only liquid stools. EXAM: ABDOMEN - 1 VIEW COMPARISON:  Abdominal series of June 24, 2015 FINDINGS: The nasogastric tube tip lies in the distal pylorus or proximal duodenum. There is small amount of gas within the stomach. There remain loops of moderately distended gas-filled small bowel in the midline. The descending colon contains a small amount of formed stool. There is stool and gas in the rectum. The bony structures exhibit no acute abnormalities. There are phleboliths within the pelvis. IMPRESSION: Stable appearing partial mid to distal small bowel obstruction. There is no evidence of perforation. Electronically Signed   By: David  [place] M.D.   On: 06/25/2015 09:56   Dg Abd 2 Views  06/26/2015  CLINICAL DATA:  Small bowel obstruction.  Abdominal pain for 2 day. EXAM: ABDOMEN - 2 VIEW COMPARISON:  06/25/2015 FINDINGS: NG tube is stable. Small bowel distention with air-fluid levels is stable. No free intraperitoneal gas. There is a small amount of colonic gas. No pneumatosis. IMPRESSION: Staples partial small bowel obstruction pattern. Electronically Signed   By: Jolaine Click M.D.   On: 06/26/2015 10:09    Assessment & Plans: Small bowel obstruction - status post ex lap and lysis of adhesions  NPO except ice chips  NG decompression  IV hydration  PCA for pain control  Encouraged OOB, ambulation  Velora Heckler,  MD, [facility] Office: [phone]   Barack Nicodemus Judie PetitM 06/27/2015

## 2015-06-27 NOTE — Progress Notes (Signed)
Pt pulse is still ranging from 135-141. Pt is asymptomatic. MD notified. NS bolus ordered and labetalol. Will continue to monitor pt  Patsey BertholdLemons, Herman Mell King .

## 2015-06-27 NOTE — Progress Notes (Addendum)
Pt alert x4, HR 120's this am, will made MD aware. Blood pressure 130/84, pulse 120, temperature 98.2 F (36.8 C), temperature source Oral, resp. rate 12, height 5\' 10"  (1.778 m), weight 124.286 kg (274 lb), SpO2 100 %.  Page received from Dr Glenice BowGuesser orders given to start 1000 ml NS bolus.  Dr. Gerrit FriendsGerkin, not Guesser.  Velora Hecklerodd M. Zaul Hubers, MD, [facility] Office: [phone]

## 2015-06-28 NOTE — Progress Notes (Signed)
Patient ID: [name], female   DOB: 1976/04/02, 40 y.o.   MRN: [id number]  General Surgery [facility]  Subjective: Patient in bed, alert, pleasant, no complaints.  No flatus or BM yet.  NG still in place.  Objective: Vital signs in last 24 hours: Temp:  [98.1 F (36.7 C)-100.3 F (37.9 C)] 98.6 F (37 C) (03/26 0954) Pulse Rate:  [126-137] 128 (03/26 0954) Resp:  [14-20] 18 (03/26 0954) BP: (116-144)/(59-88) 142/76 mmHg (03/26 0954) SpO2:  [94 %-100 %] 100 % (03/26 0954) Last BM Date: 06/25/15  Intake/Output from previous day: 03/25 0701 - 03/26 0700 In: 2250 [I.V.:1250; IV Piggyback:1000] Out: 2125 [Urine:1150; Emesis/NG output:975] Intake/Output this shift: Total I/O In: 60 [P.O.:60] Out: 400 [Urine:200; Emesis/NG output:200]  Physical Exam: HEENT - sclerae clear, mucous membranes moist Neck - soft Chest - clear bilaterally Cor - tachy to 120's Abdomen - softer, binder on; dressing dry and intact; NG with moderate bilious Ext - no edema, non-tender Neuro - alert & oriented, no focal deficits  Lab Results:   Recent Labs  06/26/15 0424 06/27/15 0426  WBC 8.4 8.6  HGB 11.7* 11.6*  HCT 36.0 36.3  PLT 280 287   BMET  Recent Labs  06/26/15 0424 06/27/15 0426  NA 139 140  K 3.4* 3.4*  CL 103 101  CO2 25 29  GLUCOSE 118* 119*  BUN 12 8  CREATININE 0.84 1.04*  CALCIUM 9.1 8.6*   PT/INR No results for input(s): LABPROT, INR in the last 72 hours. Comprehensive Metabolic Panel:    Component Value Date/Time   NA 140 06/27/2015 0426   NA 139 06/26/2015 0424   K 3.4* 06/27/2015 0426   K 3.4* 06/26/2015 0424   CL 101 06/27/2015 0426   CL 103 06/26/2015 0424   CO2 29 06/27/2015 0426   CO2 25 06/26/2015 0424   BUN 8 06/27/2015 0426   BUN 12 06/26/2015 0424   CREATININE 1.04* 06/27/2015 0426   CREATININE 0.84 06/26/2015 0424   GLUCOSE 119* 06/27/2015 0426   GLUCOSE 118* 06/26/2015 0424   CALCIUM 8.6* 06/27/2015 0426   CALCIUM 9.1 06/26/2015 0424    Studies/Results: No results found.  Assessment & Plans: Small bowel obstruction - status post ex lap and lysis of adhesions NPO except ice chips NG decompression IV hydration - several boluses yesterday for tachycardia  EKG with sinus tach PCA for pain control Encouraged OOB, ambulation  Tachycardia improved with volume, one dose of labetalol IV  Velora Hecklerodd M. Clariza Sickman, MD, [facility] Office: [phone]   [name] Judie PetitM 06/28/2015

## 2015-06-29 ENCOUNTER — Encounter (HOSPITAL_COMMUNITY): Payer: Self-pay | Admitting: Surgery

## 2015-06-29 LAB — BASIC METABOLIC PANEL
Anion gap: 9 (ref 5–15)
CALCIUM: 8.9 mg/dL (ref 8.9–10.3)
CO2: 28 mmol/L (ref 22–32)
CREATININE: 0.77 mg/dL (ref 0.44–1.00)
Chloride: 104 mmol/L (ref 101–111)
Glucose, Bld: 116 mg/dL — ABNORMAL HIGH (ref 65–99)
Potassium: 3.6 mmol/L (ref 3.5–5.1)
SODIUM: 141 mmol/L (ref 135–145)

## 2015-06-29 LAB — CBC
HCT: 32.8 % — ABNORMAL LOW (ref 36.0–46.0)
HEMOGLOBIN: 10.7 g/dL — AB (ref 12.0–15.0)
MCH: 26.5 pg (ref 26.0–34.0)
MCHC: 32.6 g/dL (ref 30.0–36.0)
MCV: 81.2 fL (ref 78.0–100.0)
PLATELETS: 269 10*3/uL (ref 150–400)
RBC: 4.04 MIL/uL (ref 3.87–5.11)
RDW: 13.1 % (ref 11.5–15.5)
WBC: 10 10*3/uL (ref 4.0–10.5)

## 2015-06-29 NOTE — Progress Notes (Signed)
Patient ID: Nicole FreezeShannon D Larmon, female   DOB: Oct 25, 1975, 40 y.o.   MRN: [id number]     [facility]      [address]   [place], [place] [id number]    Phone: [phone] FAX: [phone]     Subjective: Walked x4.  No n/v.  1L out of NGT 24h.   No flatus. Pain well controlled.  Still tachy, but improved.   Objective:  Vital signs:  Filed Vitals:   06/29/15 0230 06/29/15 0428 06/29/15 0520 06/29/15 0907  BP: 140/77  144/77 126/78  Pulse: 119  118 124  Temp: 98.8 F (37.1 C)  98.4 F (36.9 C) 98.6 F (37 C)  TempSrc: Oral  Oral Oral  Resp: 18 16 20 18   Height:      Weight:      SpO2: 100% 99% 100% 100%    Last BM Date: 06/25/15  Intake/Output   Yesterday:  03/26 0701 - 03/27 0700 In: 1680 [P.O.:180; I.V.:1500] Out: 3800 [Urine:2800; Emesis/NG output:1000] This shift:  Total I/O In: 60 [P.O.:60] Out: 950 [Urine:800; Emesis/NG output:150]   Physical Exam: General: Pt awake/alert/oriented x4 in no acute distress Chest: cta.  No chest wall pain w good excursion CV:  Pulses intact.  Regular rhythm MS: Normal AROM mjr joints.  No obvious deformity Abdomen: Soft.  Nondistended.   Mildly tender at incisions only.  Dressing removed, staples in place, no erythema, wound edges are approximated.  No evidence of peritonitis.  No incarcerated hernias. Ext:  SCDs BLE.  No mjr edema.  No cyanosis Skin: No petechiae / purpura   Problem List:   Principal Problem:   SBO (small bowel obstruction)  Active Problems:   Acid reflux   Anxiety, generalized   Small bowel obstruction (HCC)    Results:   Labs: No results found for this or any previous visit (from the past 48 hour(s)).  Imaging / Studies: No results found.  Medications / Allergies:  Scheduled Meds: . enoxaparin (LOVENOX) injection  40 mg Subcutaneous Q24H  . morphine   Intravenous 6 times per day  . pantoprazole (PROTONIX) IV  40 mg Intravenous QHS    Continuous Infusions: . dextrose 5 % and 0.45 % NaCl with KCl 20 mEq/L 125 mL/hr at 06/28/15 2203  . lactated ringers     PRN Meds:.acetaminophen **OR** acetaminophen, diphenhydrAMINE **OR** diphenhydrAMINE, naloxone **AND** sodium chloride flush, ondansetron **OR** ondansetron (ZOFRAN) IV, ondansetron (ZOFRAN) IV  Antibiotics: Anti-infectives    Start     Dose/Rate Route Frequency Ordered Stop   06/27/15 0600  [MAR Hold]  ceFAZolin (ANCEF) IVPB 2g/100 mL premix     (MAR Hold since 06/26/15 1326)   2 g 200 mL/hr over 30 Minutes Intravenous On call to O.R. 06/26/15 1205 06/26/15 1408        Assessment/Plan SBO POD#3 exploratory laparotomy, LOA---Dr. Gerrit FriendsGerkin -continue NGT, bowel rest, mobilize, IS, PCA Tachycardia-continue IV hydration, check labs VTE prophylaxis-SCD/lovenox FEN-NPO, IVF Dispo-ileus   Ashok NorrisEmina Riebock, ANP-BC [facility] Pager [phone](7A-4:30P)   06/29/2015 10:44 AM

## 2015-06-29 NOTE — Progress Notes (Signed)
Initial Nutrition Assessment  DOCUMENTATION CODES:   Obesity unspecified  INTERVENTION:   -Diet advancement per surgery -If patient is unable to have diet advanced at day 8 of NPO status, recommend nutrition support.  Once diet advanced or nutrition support initiated, please monitor magnesium, potassium, and phosphorus daily for at least 3 days, MD to replete as needed, as pt is at risk for refeeding syndrome given poor PO x 14 days and low K levels.  -RD to continue to monitor plan  NUTRITION DIAGNOSIS:   Inadequate oral intake related to inability to eat as evidenced by NPO status.  GOAL:   Patient will meet greater than or equal to 90% of their needs  MONITOR:   Diet advancement, Labs, Weight trends, I & O's  REASON FOR ASSESSMENT:   LOS (NPO x 7 days)    ASSESSMENT:   40 year old female who 6 days ago developed the onset of sharp periumbilical abdominal pain. She describes the pain as constant and moderately severe. Onset was gradual. Soon after the onset of the pain she began to have frequent nausea and vomiting. Unable to tolerate much of anything by mouth. She also did not have any bowel movements. She saw her primary physician who thought she was constipated and she was treated with laxatives. This caused some increased vomiting but she did begin to have bowel movements and describes normal bowel movements over the last few days. However the pain has continued As has been vomiting. 3/24: s/p ex lap and lysis of adhesions  Patient in room with NGT, output ~1 L/24 hours. Pt reports feeling very hungry and denies any nausea at this time. Per surgery note this am, pt to remain on bowel rest with NGT. Pt reports the last time she ate anything solid was 3/13, she had cheese grits for breakfast. On 3/20, she reports consuming only broth, applesauce and jello. Since admission pt has been NPO except for one clear liquid meal on 3/23 which she had chicken broth, sweet tea, and  jello. Pt has not had anything to eat since. Overall pt has consumed <25% of needs for 14 days. Pt is at refeeding risk and will need to monitor once oral diet/nutrition support is initiated.  Pt reports weighing herself when she walks on the unit. Last weight recorded was 280 lb which is her stated UBW. Chart review shows weight of 274 lb on 3/19 at admission. Suspect fluid is masking any weight changes, will continue to monitor.  Nutrition focused physical exam shows no sign of depletion of muscle mass or body fat.  Medications reviewed. Labs reviewed: Low K  Diet Order:  Diet NPO time specified Except for: Ice Chips  Skin:  Wound (see comment) (abdominal incision)  Last BM:  3/23  Height:   Ht Readings from Last 1 Encounters:  06/21/15 5\' 10"  (1.778 m)    Weight:   Wt Readings from Last 1 Encounters:  06/21/15 274 lb (124.286 kg)    Ideal Body Weight:  68.2 kg  BMI:  Body mass index is 39.32 kg/(m^2).  Estimated Nutritional Needs:   Kcal:  1900-2100  Protein:  80-90g  Fluid:  2L/day  EDUCATION NEEDS:   Education needs addressed  Tilda FrancoLindsey Yani Coventry, MS, RD, LDN Pager: [phone] After Hours Pager: [phone]

## 2015-06-30 NOTE — Progress Notes (Signed)
Patient ID: [name], female   DOB: 02/15/1976, 40 y.o.   MRN: [id number]     [facility]      [address] [place], [place] [address]    Phone: [phone] FAX: [phone]     Subjective: No flatus,  Less ngt output 852m bilious.  Ambulating.  VSS.  Afebrile.  Tachycardia improved.   Objective:  Vital signs:  Filed Vitals:   06/30/15 0400 06/30/15 0515 06/30/15 0752 06/30/15 0802  BP:  108/44    Pulse:  109    Temp:  98.2 F (36.8 C)    TempSrc:  Oral    Resp: 15 15 19 19   Height:      Weight:      SpO2: 98% 95% 95% 95%    Last BM Date: 06/25/15  Intake/Output   Yesterday:  03/27 0701 - 03/28 0700 In: 1648.8 [P.O.:180; I.V.:1468.8] Out: 3450 [Urine:2600; Emesis/NG output:850] This shift:    I/O last 3 completed shifts: In: 3148.8 [P.O.:180; I.V.:2968.8] Out: 5350 [Urine:4000; Emesis/NG output:1350]     Physical Exam: General: Pt awake/alert/oriented x4 in no acute distress Chest: cta. No chest wall pain w good excursion CV: Pulses intact. Regular rhythm MS: Normal AROM mjr joints. No obvious deformity Abdomen: +BS Soft. Nondistended. Mildly tender at incisions only. staples in place, no erythema, wound edges are approximated. No evidence of peritonitis. No incarcerated hernias. Ext: SCDs BLE. No mjr edema. No cyanosis Skin: No petechiae / purpura  Problem List:   Principal Problem:   SBO (small bowel obstruction)  Active Problems:   Acid reflux   Anxiety, generalized   Small bowel obstruction (HCC)    Results:   Labs: Results for orders placed or performed during the hospital encounter of 06/22/15 (from the past 48 hour(s))  CBC     Status: Abnormal   Collection Time: 06/29/15 10:48 AM  Result Value Ref Range   WBC 10.0 4.0 - 10.5 K/uL   RBC 4.04 3.87 - 5.11 MIL/uL   Hemoglobin 10.7 (L) 12.0 - 15.0 g/dL   HCT 32.8 (L) 36.0 - 46.0 %   MCV 81.2 78.0 - 100.0 fL   MCH 26.5  26.0 - 34.0 pg   MCHC 32.6 30.0 - 36.0 g/dL   RDW 13.1 11.5 - 15.5 %   Platelets 269 150 - 400 K/uL  Basic metabolic panel     Status: Abnormal   Collection Time: 06/29/15 10:48 AM  Result Value Ref Range   Sodium 141 135 - 145 mmol/L   Potassium 3.6 3.5 - 5.1 mmol/L   Chloride 104 101 - 111 mmol/L   CO2 28 22 - 32 mmol/L   Glucose, Bld 116 (H) 65 - 99 mg/dL   BUN <5 (L) 6 - 20 mg/dL   Creatinine, Ser 0.77 0.44 - 1.00 mg/dL   Calcium 8.9 8.9 - 10.3 mg/dL   GFR calc non Af Amer >60 >60 mL/min   GFR calc Af Amer >60 >60 mL/min    Comment: (NOTE) The eGFR has been calculated using the CKD EPI equation. This calculation has not been validated in all clinical situations. eGFR's persistently <60 mL/min signify possible Chronic Kidney Disease.    Anion gap 9 5 - 15    Imaging / Studies: No results found.  Medications / Allergies:  Scheduled Meds: . enoxaparin (LOVENOX) injection  40 mg Subcutaneous Q24H  . morphine   Intravenous 6 times per day  . pantoprazole (PROTONIX) IV  40 mg Intravenous QHS   Continuous Infusions: . dextrose 5 % and 0.45 % NaCl with KCl 20 mEq/L 125 mL/hr (06/29/15 2109)  . lactated ringers     PRN Meds:.acetaminophen **OR** acetaminophen, diphenhydrAMINE **OR** diphenhydrAMINE, naloxone **AND** sodium chloride flush, ondansetron **OR** ondansetron (ZOFRAN) IV, ondansetron (ZOFRAN) IV  Antibiotics: Anti-infectives    Start     Dose/Rate Route Frequency Ordered Stop   06/27/15 0600  [MAR Hold]  ceFAZolin (ANCEF) IVPB 2g/100 mL premix     (MAR Hold since 06/26/15 1326)   2 g 200 mL/hr over 30 Minutes Intravenous On call to O.R. 06/26/15 1205 06/26/15 1408         Assessment/Plan SBO POD#4 exploratory laparotomy, LOA---Dr. Harlow Asa -continue NGT, bowel rest, mobilize, IS, PCA.  If unable to clamp tomorrow, will need to start TPN(NPO x8 days) Tachycardia-improved with hydration  VTE prophylaxis-SCD/lovenox FEN-NPO, IVF Dispo-ileus    Erby Pian, ANP-BC [facility] Pager [phone](7A-4:30P)   06/30/2015 9:58 AM   I have seen and examined this patient and agree with the assessment and plan.   Matt B. Hassell Done, MD, [facility] [phone] beeper [phone]  07/01/2015 7:49 AM

## 2015-07-01 MED ORDER — BOOST / RESOURCE BREEZE PO LIQD
1.0000 | Freq: Three times a day (TID) | ORAL | Status: DC
  Administered 2015-07-01 – 2015-07-03 (×4): 1 via ORAL

## 2015-07-01 NOTE — Progress Notes (Signed)
Patient ID: [name], female   DOB: [birth year], 40 y.o.   MRN: [id number]     [facility]      [address] [place], [place] [address]    Phone: [phone] FAX: [phone]     Subjective: Passed flatus this am.  VSS.  Afebrile. Still with mild tachycardia.  No sob, cp.   Objective:  Vital signs:  Filed Vitals:   07/01/15 0112 07/01/15 0324 07/01/15 0510 07/01/15 0800  BP: 129/81  123/73   Pulse: 108  110   Temp: 98.4 F (36.9 C)  98.8 F (37.1 C)   TempSrc: Oral  Oral   Resp: _0 Height:      Weight:      SpO2: 97% 98% 100% 100%    Last BM Date: 06/25/15  Intake/Output   Yesterday:  03/28 0701 - 03/29 0700 In: 620 [P.O.:120; I.V.:500] Out: 2625 [Urine:2150; Emesis/NG output:475] This shift: I/O last 3 completed shifts: In: 620 [P.O.:120; I.V.:500] Out: 2542 [Urine:2900; Emesis/NG output:875]    Physical Exam: General: Pt awake/alert/oriented x4 in no acute distress Chest: cta. No chest wall pain w good excursion CV: Pulses intact. Regular rhythm MS: Normal AROM mjr joints. No obvious deformity Abdomen: +BS Soft. Nondistended. Mildly tender at incisions only. staples in place, no erythema, wound edges are approximated. No evidence of peritonitis. No incarcerated hernias. Ext: SCDs BLE. No mjr edema. No cyanosis Skin: No petechiae / purpura   Problem List:   Principal Problem:   SBO (small bowel obstruction)  Active Problems:   Acid reflux   Anxiety, generalized   Small bowel obstruction (HCC)    Results:   Labs: Results for orders placed or performed during the hospital encounter of 06/22/15 (from the past 48 hour(s))  CBC     Status: Abnormal   Collection Time: 06/29/15 10:48 AM  Result Value Ref Range   WBC 10.0 4.0 - 10.5 K/uL   RBC 4.04 3.87 - 5.11 MIL/uL   Hemoglobin 10.7 (L) 12.0 - 15.0 g/dL   HCT 32.8 (L) 36.0 - 46.0 %   MCV 81.2 78.0 - 100.0 fL   MCH 26.5  26.0 - 34.0 pg   MCHC 32.6 30.0 - 36.0 g/dL   RDW 13.1 11.5 - 15.5 %   Platelets 269 150 - 400 K/uL  Basic metabolic panel     Status: Abnormal   Collection Time: 06/29/15 10:48 AM  Result Value Ref Range   Sodium 141 135 - 145 mmol/L   Potassium 3.6 3.5 - 5.1 mmol/L   Chloride 104 101 - 111 mmol/L   CO2 28 22 - 32 mmol/L   Glucose, Bld 116 (H) 65 - 99 mg/dL   BUN <5 (L) 6 - 20 mg/dL   Creatinine, Ser 0.77 0.44 - 1.00 mg/dL   Calcium 8.9 8.9 - 10.3 mg/dL   GFR calc non Af Amer >60 >60 mL/min   GFR calc Af Amer >60 >60 mL/min    Comment: (NOTE) The eGFR has been calculated using the CKD EPI equation. This calculation has not been validated in all clinical situations. eGFR's persistently <60 mL/min signify possible Chronic Kidney Disease.    Anion gap 9 5 - 15    Imaging / Studies: No results found.  Medications / Allergies:  Scheduled Meds: . enoxaparin (LOVENOX) injection  40 mg Subcutaneous Q24H  . morphine   Intravenous 6 times per day  . pantoprazole (PROTONIX) IV  40 mg Intravenous QHS   Continuous Infusions: . dextrose 5 % and 0.45 % NaCl with KCl 20 mEq/L 125 mL/hr (07/01/15 0325)  . lactated ringers     PRN Meds:.acetaminophen **OR** acetaminophen, diphenhydrAMINE **OR** diphenhydrAMINE, naloxone **AND** sodium chloride flush, ondansetron **OR** ondansetron (ZOFRAN) IV, ondansetron (ZOFRAN) IV  Antibiotics: Anti-infectives    Start     Dose/Rate Route Frequency Ordered Stop   06/27/15 0600  [MAR Hold]  ceFAZolin (ANCEF) IVPB 2g/100 mL premix     (MAR Hold since 06/26/15 1326)   2 g 200 mL/hr over 30 Minutes Intravenous On call to O.R. 06/26/15 1205 06/26/15 1408        Assessment/Plan SBO POD#5 exploratory laparotomy, LOA---Dr. Harlow Asa -clamp NGT and allow for clears, mobilize, IS, PCA. s) Tachycardia-improved with hydration  VTE prophylaxis-SCD/lovenox FEN-clears.  IVF, AM labs [place], [facility] Pager  [phone](7A-4:30P)   07/01/2015 9:43 AM

## 2015-07-01 NOTE — Progress Notes (Signed)
Wasted 3 mL or 6 mg of morphine PCA with Alden Hippara Graves as my witness.

## 2015-07-01 NOTE — Progress Notes (Signed)
Nutrition Follow-up  DOCUMENTATION CODES:   Obesity unspecified  INTERVENTION:   Please monitor magnesium, potassium, and phosphorus daily for at least 3 days, MD to replete as needed, as pt is at risk for refeeding syndrome given poor PO x 14 days and low K levels.  -Provide Boost Breeze po TID, each supplement provides 250 kcal and 9 grams of protein -If patient unable to tolerate diet, may need to consider nutrition support. -RD to continue to monitor for needs  NUTRITION DIAGNOSIS:   Inadequate oral intake related to inability to eat as evidenced by NPO status.  GOAL:   Patient will meet greater than or equal to 90% of their needs  MONITOR:   Diet advancement, Labs, Weight trends, I & O's    ASSESSMENT:   40 year old female who 6 days ago developed the onset of sharp periumbilical abdominal pain. She describes the pain as constant and moderately severe. Onset was gradual. Soon after the onset of the pain she began to have frequent nausea and vomiting. Unable to tolerate much of anything by mouth. She also did not have any bowel movements. She saw her primary physician who thought she was constipated and she was treated with laxatives. This caused some increased vomiting but she did begin to have bowel movements and describes normal bowel movements over the last few days. However the pain has continued As has been vomiting.  Patient's diet has been advanced to clear liquids. NGT has been clamped, pt denies any nausea currently. States she tolerated 1/2 a grape popsicle with no issues. Pt is willing to try Boost Breeze supplements while she is on clears. RD provided a supplement for patient to try and pt liked it enough for RD to order. Will continue to monitor for tolerance and any other nutritional needs.  Labs reviewed. Medications: D5 w/ .45% NaCl & KCl 20 infusion @ 125 ml/hr -provides 510 kcal   Diet Order:  Diet clear liquid Room service appropriate?: Yes; Fluid  consistency:: Thin  Skin:  Wound (see comment) (abdominal incision)  Last BM:  3/23  Height:   Ht Readings from Last 1 Encounters:  06/21/15 5\' 10"  (1.778 m)    Weight:   Wt Readings from Last 1 Encounters:  06/21/15 274 lb (124.286 kg)    Ideal Body Weight:  68.2 kg  BMI:  Body mass index is 39.32 kg/(m^2).  Estimated Nutritional Needs:   Kcal:  1900-2100  Protein:  80-90g  Fluid:  2L/day  EDUCATION NEEDS:   Education needs addressed  Tilda FrancoLindsey Mareon Robinette, MS, RD, LDN Pager: [phone] After Hours Pager: [phone]

## 2015-07-02 LAB — BASIC METABOLIC PANEL
Anion gap: 9 (ref 5–15)
BUN: <5 mg/dL — ABNORMAL LOW (ref 6–20)
CALCIUM: 9 mg/dL (ref 8.9–10.3)
CHLORIDE: 103 mmol/L (ref 101–111)
CO2: 29 mmol/L (ref 22–32)
CREATININE: 0.78 mg/dL (ref 0.44–1.00)
Glucose, Bld: 118 mg/dL — ABNORMAL HIGH (ref 65–99)
POTASSIUM: 3.7 mmol/L (ref 3.5–5.1)
Sodium: 141 mmol/L (ref 135–145)

## 2015-07-02 LAB — CBC
HEMATOCRIT: 34.6 % — AB (ref 36.0–46.0)
HEMOGLOBIN: 11.1 g/dL — AB (ref 12.0–15.0)
MCH: 26.2 pg (ref 26.0–34.0)
MCHC: 32.1 g/dL (ref 30.0–36.0)
MCV: 81.8 fL (ref 78.0–100.0)
Platelets: 323 10*3/uL (ref 150–400)
RBC: 4.23 MIL/uL (ref 3.87–5.11)
RDW: 13 % (ref 11.5–15.5)
WBC: 8.2 10*3/uL (ref 4.0–10.5)

## 2015-07-02 LAB — MAGNESIUM: MAGNESIUM: 1.6 mg/dL — AB (ref 1.7–2.4)

## 2015-07-02 LAB — PHOSPHORUS: Phosphorus: 4.2 mg/dL (ref 2.5–4.6)

## 2015-07-02 MED ORDER — MAGNESIUM SULFATE 2 GM/50ML IV SOLN
2.0000 g | Freq: Once | INTRAVENOUS | Status: AC
  Administered 2015-07-02: 2 g via INTRAVENOUS
  Filled 2015-07-02: qty 50

## 2015-07-02 MED ORDER — OXYCODONE-ACETAMINOPHEN 5-325 MG PO TABS: 1.0000 | ORAL_TABLET | ORAL | Status: DC | PRN

## 2015-07-02 MED ORDER — LIP MEDEX EX OINT
TOPICAL_OINTMENT | CUTANEOUS | Status: AC
  Administered 2015-07-02: 02:00:00
  Filled 2015-07-02: qty 7

## 2015-07-02 NOTE — Progress Notes (Signed)
Patient ID: [name], female   DOB: 07/15/1975, 39 y.o.   MRN: [id number]     [facility]      [address]   [place], [place] [address]    Phone: [phone] FAX: [phone]     Subjective: Had a bm tolerating po's voiding. Mg 1.6  Objective:  Vital signs:  Filed Vitals:   07/02/15 0129 07/02/15 0400 07/02/15 0445 07/02/15 0731  BP: 137/87  138/93   Pulse: 119  119   Temp: 98.4 F (36.9 C)  97.7 F (36.5 C)   TempSrc: Oral  Oral   Resp: 16 18 19 20  Height:      Weight:      SpO2: 98% 97% 94% 96%    Last BM Date: 06/24/15  Intake/Output   Yesterday:  03/29 0701 - 03/30 0700 In: 2320 [P.O.:320; I.V.:2000] Out: 2861 [Urine:2750; Emesis/NG output:110; Stool:1] This shift:    I/O last 3 completed shifts: In: 2320 [P.O.:320; I.V.:2000] Out: 4186 [Urine:3700; Emesis/NG output:485; Stool:1]     Physical Exam: General: Pt awake/alert/oriented x4 in no acute distress Chest: cta. No chest wall pain w good excursion CV: Pulses intact. Regular rhythm MS: Normal AROM mjr joints. No obvious deformity Abdomen: +BS Soft. Nondistended. Mildly tender at incisions only. staples in place, no erythema, wound edges are approximated. No evidence of peritonitis. No incarcerated hernias. Ext: SCDs BLE. No mjr edema. No cyanosis Skin: No petechiae / purpura  Problem List:   Principal Problem:   SBO (small bowel obstruction)  Active Problems:   Acid reflux   Anxiety, generalized   Small bowel obstruction (HCC)    Results:   Labs: Results for orders placed or performed during the hospital encounter of 06/22/15 (from the past 48 hour(s))  CBC     Status: Abnormal   Collection Time: 07/02/15  4:29 AM  Result Value Ref Range   WBC 8.2 4.0 - 10.5 K/uL   RBC 4.23 3.87 - 5.11 MIL/uL   Hemoglobin 11.1 (L) 12.0 - 15.0 g/dL   HCT 34.6 (L) 36.0 - 46.0 %   MCV 81.8 78.0 - 100.0 fL   MCH 26.2 26.0 - 34.0 pg   MCHC 32.1 30.0 - 36.0 g/dL   RDW 13.0 11.5 - 15.5 %   Platelets 323 150 - 400 K/uL  Basic metabolic panel     Status: Abnormal   Collection Time: 07/02/15  4:29 AM  Result Value Ref Range   Sodium 141 135 - 145 mmol/L   Potassium 3.7 3.5 - 5.1 mmol/L   Chloride 103 101 - 111 mmol/L   CO2 29 22 - 32 mmol/L   Glucose, Bld 118 (H) 65 - 99 mg/dL   BUN <5 (L) 6 - 20 mg/dL   Creatinine, Ser 0.78 0.44 - 1.00 mg/dL   Calcium 9.0 8.9 - 10.3 mg/dL   GFR calc non Af Amer >60 >60 mL/min   GFR calc Af Amer >60 >60 mL/min    Comment: (NOTE) The eGFR has been calculated using the CKD EPI equation. This calculation has not been validated in all clinical situations. eGFR's persistently <60 mL/min signify possible Chronic Kidney Disease.    Anion gap 9 5 - 15  Magnesium     Status: Abnormal   Collection Time: 07/02/15  4:29 AM  Result Value Ref Range   Magnesium 1.6 (L) 1.7 - 2.4 mg/dL  Phosphorus     Status: None   Collection Time: 07/02/15    4:29 AM  Result Value Ref Range   Phosphorus 4.2 2.5 - 4.6 mg/dL    Imaging / Studies: No results found.  Medications / Allergies:  Scheduled Meds: . enoxaparin (LOVENOX) injection  40 mg Subcutaneous Q24H  . feeding supplement  1 Container Oral TID BM  . magnesium sulfate 1 - 4 g bolus IVPB  2 g Intravenous Once  . morphine   Intravenous 6 times per day  . pantoprazole (PROTONIX) IV  40 mg Intravenous QHS   Continuous Infusions: . dextrose 5 % and 0.45 % NaCl with KCl 20 mEq/L 125 mL/hr at 07/02/15 0544  . lactated ringers     PRN Meds:.acetaminophen **OR** acetaminophen, diphenhydrAMINE **OR** diphenhydrAMINE, naloxone **AND** sodium chloride flush, ondansetron **OR** ondansetron (ZOFRAN) IV, ondansetron (ZOFRAN) IV  Antibiotics: Anti-infectives    Start     Dose/Rate Route Frequency Ordered Stop   06/27/15 0600  [MAR Hold]  ceFAZolin (ANCEF) IVPB 2g/100 mL premix     (MAR Hold since 06/26/15 1326)   2 g 200 mL/hr over 30 Minutes  Intravenous On call to O.R. 06/26/15 1205 06/26/15 1408        Assessment/Plan SBO POD#6 exploratory laparotomy, LOA---Dr. Harlow Asa -DC NGT and advance to fulls, DC PCA, add po pain meds, mobilize  Tachycardia-improved with hydration  VTE prophylaxis-SCD/lovenox FEN-fulls, reduce IVF, give 2g Mg run now.  Repeat labs in AM Dispo-anticipate DC in AM   [name], [facility] Pager [phone](7A-4:30P)   07/02/2015 8:33 AM

## 2015-07-03 LAB — BASIC METABOLIC PANEL
ANION GAP: 7 (ref 5–15)
BUN: <5 mg/dL — ABNORMAL LOW (ref 6–20)
CALCIUM: 8.8 mg/dL — AB (ref 8.9–10.3)
CO2: 27 mmol/L (ref 22–32)
Chloride: 107 mmol/L (ref 101–111)
Creatinine, Ser: 0.86 mg/dL (ref 0.44–1.00)
GLUCOSE: 120 mg/dL — AB (ref 65–99)
POTASSIUM: 3.8 mmol/L (ref 3.5–5.1)
Sodium: 141 mmol/L (ref 135–145)

## 2015-07-03 LAB — PHOSPHORUS: Phosphorus: 3.6 mg/dL (ref 2.5–4.6)

## 2015-07-03 LAB — MAGNESIUM: MAGNESIUM: 1.9 mg/dL (ref 1.7–2.4)

## 2015-07-03 MED ORDER — MORPHINE SULFATE (PF) 2 MG/ML IV SOLN: 2.0000 mg | INTRAVENOUS | Status: DC | PRN

## 2015-07-03 MED ORDER — OXYCODONE-ACETAMINOPHEN 5-325 MG PO TABS: 1.0000 | ORAL_TABLET | ORAL | Status: AC | PRN

## 2015-07-03 NOTE — Discharge Instructions (Signed)

## 2015-07-03 NOTE — Progress Notes (Signed)
Discharge instructions given to patient along with prescription.  Questions answered 

## 2015-07-03 NOTE — Discharge Summary (Signed)
Physician Discharge Summary  Drenda FreezeShannon D Tinnell ZOX:[id number] DOB: Apr 13, 1975 DOA: 06/22/2015  PCP: Delbert HarnessBRISCOE, KIM, MD  Consultation: none  Admit date: 06/22/2015 Discharge date: 07/03/2015  Recommendations for Outpatient Follow-up:   Follow-up Information    Follow up with Velora HecklerGERKIN,TODD M, MD On 07/16/2015.   Specialty:  General Surgery   Why:  arrive by 2:45PM for a 3PM post op check   Contact information:   [address] [place] [address] [phone]       Follow up with [facility] On 07/07/2015.   Specialty:  General Surgery   Why:  appt time:2pm to have your staples removed with Dr. Ardine EngGerkin's nurse.  arrive 30 mins prior to appt time.    Contact information:   [address] [place] [address] [phone]      Discharge Diagnoses:  1. SBO 2. Tachycardia 3. Hypomagnesemia    Surgical Procedure: exploratory laparotomy, LOA---Dr. Gerrit FriendsGerkin 06/26/15  Discharge Condition: stable Disposition: home  Diet recommendation: regular  Filed Weights   06/21/15 2151  Weight: 124.286 kg (274 lb)     Filed Vitals:   07/03/15 0800 07/03/15 0900  BP:  143/90  Pulse:  102  Temp:  98.6 F (37 C)  Resp: 20 18     Hospital Course:  [name] with a 6 day history of abdominal pain.  Work up revealed a small bowel obstruction.  She was admitted for NGT decompression and bowel rest.  Initially started to improve, but subsequently developed obstructive symptoms and required a laparotomy with LOA on HD#4.  Tolerated the procedure well.  She developed post op tachycardia which resolved with IV hydration.  Kept to Lovenox VTE prophylaxis.  Had a expected prolonged post op ileus.  Diet as advanced as it resolved.  On POD#7 she was having BMs, tolerating POs, ambulating and pain well controlled.  She was therefore felt stable for discharge home. Medication risks, benefits and therapeutic alternatives were reviewed with the patient.   She verbalizes understanding. She knows to call with questions or concerns.     Physical Exam: General: Pt awake/alert/oriented x4 in no acute distress Chest: cta. No chest wall pain w good excursion CV: Pulses intact. Regular rhythm MS: Normal AROM mjr joints. No obvious deformity Abdomen: +BS Soft. Nondistended. Mildly tender at incisions only. staples in place, no erythema, wound edges are approximated. No evidence of peritonitis. No incarcerated hernias. Ext: SCDs BLE. No mjr edema. No cyanosis Skin: No petechiae / purpura   Discharge Instructions     Medication List    STOP taking these medications        lubiprostone 24 MCG capsule  Commonly known as:  AMITIZA      TAKE these medications        multivitamin with minerals Tabs tablet  Take 1 tablet by mouth daily.     omeprazole 20 MG capsule  Commonly known as:  PRILOSEC  Take 1 capsule (20 mg total) by mouth daily.     ondansetron 4 MG disintegrating tablet  Commonly known as:  ZOFRAN-ODT  Take 4 mg by mouth every 4 (four) hours as needed. For nausea     oxyCODONE-acetaminophen 5-325 MG tablet  Commonly known as:  PERCOCET/ROXICET  Take 1-2 tablets by mouth every 4 (four) hours as needed for moderate pain.     phentermine 37.5 MG tablet  Commonly known as:  ADIPEX-P  Take 37.5 mg by mouth daily.  Follow-up Information    Follow up with Velora Heckler, MD On 07/16/2015.   Specialty:  General Surgery   Why:  arrive by 2:45PM for a 3PM post op check   Contact information:   [address] [place] [address] [phone]       Follow up with [facility] On 07/07/2015.   Specialty:  General Surgery   Why:  appt time:2pm to have your staples removed with Dr. Ardine Eng nurse.  arrive 30 mins prior to appt time.    Contact information:   [address] [place] [address] [phone]        The results of significant diagnostics from this  hospitalization (including imaging, microbiology, ancillary and laboratory) are listed below for reference.    Significant Diagnostic Studies: Dg Abd 1 View  06/25/2015  CLINICAL DATA:  Mid abdominal pain and constipation; history of small-bowel obstruction. Patient is passing only liquid stools. EXAM: ABDOMEN - 1 VIEW COMPARISON:  Abdominal series of June 24, 2015 FINDINGS: The nasogastric tube tip lies in the distal pylorus or proximal duodenum. There is small amount of gas within the stomach. There remain loops of moderately distended gas-filled small bowel in the midline. The descending colon contains a small amount of formed stool. There is stool and gas in the rectum. The bony structures exhibit no acute abnormalities. There are phleboliths within the pelvis. IMPRESSION: Stable appearing partial mid to distal small bowel obstruction. There is no evidence of perforation. Electronically Signed   By: David  [place] M.D.   On: 06/25/2015 09:56   Dg Abd 1 View  06/24/2015  CLINICAL DATA:  Follow-up small bowel obstruction EXAM: ABDOMEN - 1 VIEW COMPARISON:  06/22/2015 FINDINGS: Stable NG tube. Loops of dilated left upper quadrant small bowel again identified. Dilatation is slightly less, decreasing from a maximum diameter of 42 mm previously to 36 mm currently. There are few or dilated loops. There is progression of some gas into distal small bowel and proximal colon. IMPRESSION: Persistent but mildly improved small-bowel obstruction findings Electronically Signed   By: Esperanza Heir M.D.   On: 06/24/2015 10:21   Ct Abdomen Pelvis W Contrast  06/22/2015  CLINICAL DATA:  Constant cramping generalized abdominal pain for a week. Nausea and vomiting. EXAM: CT ABDOMEN AND PELVIS WITH CONTRAST TECHNIQUE: Multidetector CT imaging of the abdomen and pelvis was performed using the standard protocol following bolus administration of intravenous contrast. CONTRAST:  25mL OMNIPAQUE IOHEXOL 300 MG/ML SOLN,  OMNIPAQUE IOHEXOL 300 MG/ML SOLN COMPARISON:  11/21/2006 FINDINGS: Dependent atelectasis in the lung bases. The liver, spleen, gallbladder, pancreas, adrenal glands, kidneys, abdominal aorta, inferior vena cava, and retroperitoneal lymph nodes are unremarkable. Proximal small bowel are distended with gas and fluid and multiple air-fluid levels are present. There is mild edema in the mesenteric. No small bowel wall thickening. Distal small bowel are decompressed. Appearance is consistent with small bowel obstruction. Transition zone appears to be in the right lower quadrant although is not specifically identified. Cause is not identified. No free air in the abdomen. Pelvis: Bladder wall is not thickened. Uterus is surgically absent. No pelvic mass or lymphadenopathy. No free or loculated pelvic fluid collections. The appendix is normal. No evidence of vasculitis. No destructive bone lesions. IMPRESSION: Distended small bowel with air-fluid levels consistent with small bowel obstruction. Transition zone is not specifically identified but appears to be within the right lower quadrant. Distal ileum are decompressed. Small amount of mesenteric edema.  No small bowel wall thickening. Electronically Signed   By: Burman Nieves M.D.   On: 06/22/2015 03:11   Dg Abd 2 Views  06/26/2015  CLINICAL DATA:  Small bowel obstruction.  Abdominal pain for 2 day. EXAM: ABDOMEN - 2 VIEW COMPARISON:  06/25/2015 FINDINGS: NG tube is stable. Small bowel distention with air-fluid levels is stable. No free intraperitoneal gas. There is a small amount of colonic gas. No pneumatosis. IMPRESSION: Staples partial small bowel obstruction pattern. Electronically Signed   By: Jolaine Click M.D.   On: 06/26/2015 10:09   Dg Abd Acute W/chest  06/22/2015  CLINICAL DATA:  Abdominal pain, nausea and vomiting yesterday. Seen for the same symptoms last Monday. CT again on Wednesday. Persistent abdominal pain. EXAM: DG ABDOMEN ACUTE W/ 1V CHEST  COMPARISON:  Chest 02/22/2015 FINDINGS: Normal heart size and pulmonary vascularity. No focal airspace disease or consolidation in the lungs. No blunting of costophrenic angles. No pneumothorax. Mediastinal contours appear intact. Multiple gas-filled mildly distended left upper quadrant and mid abdominal small bowel loops with multiple air-fluid levels. Appearance is consistent with small bowel obstruction. No free intra-abdominal air. Paucity of gas in the colon. No radiopaque stones. Visualized bones appear intact. IMPRESSION: Gas distended small bowel with air-fluid levels consistent with small bowel obstruction. Electronically Signed   By: Burman Nieves M.D.   On: 06/22/2015 01:11   Dg Abd Portable 1v-small Bowel Obstruction Protocol-initial, 8 Hr Delay  06/22/2015  CLINICAL DATA:  Small-bowel obstruction, 8 hour film EXAM: PORTABLE ABDOMEN - 1 VIEW COMPARISON:  06/22/2015 FINDINGS: Nasogastric catheter is again noted within the stomach. Contrast material is been administered but still lies within the stomach. There has been no significant passage of contrast distally after 8 hours of administration. Persistent small bowel dilatation is seen. No free air is noted. IMPRESSION: No significant passage of contrast from the stomach into the small bowel despite 8 hours of follow-up. Followup film at 24 hours following administration is recommended. Electronically Signed   By: Alcide Clever M.D.   On: 06/22/2015 19:45   Dg Abd Portable 1v-small Bowel Protocol-position Verification  06/22/2015  CLINICAL DATA:  NG tube placement EXAM: PORTABLE ABDOMEN - 1 VIEW COMPARISON:  06/22/2015 FINDINGS: Again noted distended small bowel loops consistent with small bowel obstruction. There is NG-tube coiled within proximal stomach with tip in mid stomach. IMPRESSION: NG tube coiled within proximal stomach with tip in mid stomach. Again noted gaseous distended small bowel loops consistent with small bowel obstruction.  Electronically Signed   By: Natasha Mead M.D.   On: 06/22/2015 10:23    Microbiology: Recent Results (from the past 240 hour(s))  Surgical pcr screen     Status: Abnormal   Collection Time: 06/26/15 12:26 PM  Result Value Ref Range Status   MRSA, PCR NEGATIVE NEGATIVE Final   Staphylococcus aureus POSITIVE (A) NEGATIVE Final    Comment:        The Xpert SA Assay (FDA approved for NASAL specimens in patients over 23 years of age), is one component of a comprehensive surveillance program.  Test performance has been validated by [facility] for patients greater than or equal to 7 year old. It is not intended to diagnose infection nor to guide or monitor treatment.      Labs: Basic Metabolic Panel:  Recent Labs Lab 06/27/15 0426 06/29/15 1048 07/02/15 0429 07/03/15 0415  NA 140 141 141 141  K 3.4* 3.6 3.7 3.8  CL 101 104 103 107  CO2  GLUCOSE 119* 116* 118* 120*  BUN 8 <5* <5* <5*  CREATININE 1.04* 0.77 0.78 0.86  CALCIUM 8.6* 8.9 9.0 8.8*  MG  --   --  1.6* 1.9  PHOS  --   --  4.2 3.6   Liver Function Tests: No results for input(s): AST, ALT, ALKPHOS, BILITOT, PROT, ALBUMIN in the last 168 hours. No results for input(s): LIPASE, AMYLASE in the last 168 hours. No results for input(s): AMMONIA in the last 168 hours. CBC:  Recent Labs Lab 06/27/15 0426 06/29/15 1048 07/02/15 0429  WBC 8.6 10.0 8.2  HGB 11.6* 10.7* 11.1*  HCT 36.3 32.8* 34.6*  MCV 82.3 81.2 81.8  PLT 287 269 323   Cardiac Enzymes: No results for input(s): CKTOTAL, CKMB, CKMBINDEX, TROPONINI in the last 168 hours. BNP: BNP (last 3 results) No results for input(s): BNP in the last 8760 hours.  ProBNP (last 3 results) No results for input(s): PROBNP in the last 8760 hours.  CBG: No results for input(s): GLUCAP in the last 168 hours.  Principal Problem:   SBO (small bowel obstruction)  Active Problems:   Acid reflux   Anxiety, generalized   Small bowel obstruction  (HCC)   Time coordinating discharge:   Signed:  Kashay Cavenaugh, ANP-BC

## 2016-02-16 ENCOUNTER — Other Ambulatory Visit: Payer: Self-pay | Admitting: Family Medicine

## 2016-02-16 DIAGNOSIS — Z1231 Encounter for screening mammogram for malignant neoplasm of breast: Secondary | ICD-10-CM

## 2016-03-22 ENCOUNTER — Ambulatory Visit
Admission: RE | Admit: 2016-03-22 | Discharge: 2016-03-22 | Disposition: A | Discharge status: Home / Self Care | Payer: 59 | Source: Ambulatory Visit | Attending: Family Medicine | Admitting: Family Medicine

## 2016-03-22 DIAGNOSIS — Z1231 Encounter for screening mammogram for malignant neoplasm of breast: Secondary | ICD-10-CM

## 2017-02-06 ENCOUNTER — Other Ambulatory Visit: Payer: Self-pay | Admitting: Family Medicine

## 2017-02-06 DIAGNOSIS — Z1231 Encounter for screening mammogram for malignant neoplasm of breast: Secondary | ICD-10-CM

## 2017-03-23 ENCOUNTER — Ambulatory Visit
Admission: RE | Admit: 2017-03-23 | Discharge: 2017-03-23 | Disposition: A | Discharge status: Home / Self Care | Payer: 59 | Source: Ambulatory Visit | Attending: Family Medicine | Admitting: Family Medicine

## 2017-03-23 DIAGNOSIS — Z1231 Encounter for screening mammogram for malignant neoplasm of breast: Secondary | ICD-10-CM

## 2018-03-16 ENCOUNTER — Other Ambulatory Visit: Payer: Self-pay | Admitting: Family Medicine

## 2018-03-16 DIAGNOSIS — Z1231 Encounter for screening mammogram for malignant neoplasm of breast: Secondary | ICD-10-CM

## 2018-03-26 ENCOUNTER — Ambulatory Visit
Admission: RE | Admit: 2018-03-26 | Discharge: 2018-03-26 | Disposition: A | Discharge status: Home / Self Care | Payer: 59 | Source: Ambulatory Visit

## 2018-03-26 DIAGNOSIS — Z1231 Encounter for screening mammogram for malignant neoplasm of breast: Secondary | ICD-10-CM

## 2019-02-04 ENCOUNTER — Other Ambulatory Visit: Payer: Self-pay | Admitting: Family Medicine

## 2019-02-04 DIAGNOSIS — Z1231 Encounter for screening mammogram for malignant neoplasm of breast: Secondary | ICD-10-CM

## 2019-04-01 ENCOUNTER — Other Ambulatory Visit: Payer: Self-pay

## 2019-04-01 ENCOUNTER — Ambulatory Visit
Admission: RE | Admit: 2019-04-01 | Discharge: 2019-04-01 | Disposition: A | Discharge status: Home / Self Care | Payer: 59 | Source: Ambulatory Visit | Attending: Family Medicine | Admitting: Family Medicine

## 2019-04-01 DIAGNOSIS — Z1231 Encounter for screening mammogram for malignant neoplasm of breast: Secondary | ICD-10-CM

## 2020-01-25 ENCOUNTER — Emergency Department (HOSPITAL_BASED_OUTPATIENT_CLINIC_OR_DEPARTMENT_OTHER): Payer: 59

## 2020-01-25 ENCOUNTER — Other Ambulatory Visit: Payer: Self-pay

## 2020-01-25 ENCOUNTER — Encounter (HOSPITAL_BASED_OUTPATIENT_CLINIC_OR_DEPARTMENT_OTHER): Payer: Self-pay | Admitting: *Deleted

## 2020-01-25 ENCOUNTER — Inpatient Hospital Stay (HOSPITAL_BASED_OUTPATIENT_CLINIC_OR_DEPARTMENT_OTHER)
Admission: EM | Admit: 2020-01-25 | Discharge: 2020-01-28 | DRG: 166 | Disposition: A | Discharge status: Home / Self Care | Payer: 59 | Attending: Internal Medicine | Admitting: Internal Medicine

## 2020-01-25 DIAGNOSIS — J9691 Respiratory failure, unspecified with hypoxia: Secondary | ICD-10-CM | POA: Diagnosis present

## 2020-01-25 DIAGNOSIS — Z791 Long term (current) use of non-steroidal anti-inflammatories (NSAID): Secondary | ICD-10-CM | POA: Diagnosis not present

## 2020-01-25 DIAGNOSIS — Z20822 Contact with and (suspected) exposure to covid-19: Secondary | ICD-10-CM | POA: Diagnosis present

## 2020-01-25 DIAGNOSIS — K219 Gastro-esophageal reflux disease without esophagitis: Secondary | ICD-10-CM | POA: Diagnosis present

## 2020-01-25 DIAGNOSIS — I2602 Saddle embolus of pulmonary artery with acute cor pulmonale: Secondary | ICD-10-CM | POA: Diagnosis present

## 2020-01-25 DIAGNOSIS — Z90710 Acquired absence of both cervix and uterus: Secondary | ICD-10-CM

## 2020-01-25 DIAGNOSIS — R9431 Abnormal electrocardiogram [ECG] [EKG]: Secondary | ICD-10-CM | POA: Diagnosis present

## 2020-01-25 DIAGNOSIS — I2609 Other pulmonary embolism with acute cor pulmonale: Secondary | ICD-10-CM | POA: Diagnosis not present

## 2020-01-25 DIAGNOSIS — Z79899 Other long term (current) drug therapy: Secondary | ICD-10-CM | POA: Diagnosis not present

## 2020-01-25 DIAGNOSIS — N179 Acute kidney failure, unspecified: Secondary | ICD-10-CM | POA: Diagnosis present

## 2020-01-25 DIAGNOSIS — Z6841 Body Mass Index (BMI) 40.0 and over, adult: Secondary | ICD-10-CM | POA: Diagnosis not present

## 2020-01-25 DIAGNOSIS — E785 Hyperlipidemia, unspecified: Secondary | ICD-10-CM | POA: Diagnosis present

## 2020-01-25 DIAGNOSIS — I2601 Septic pulmonary embolism with acute cor pulmonale: Secondary | ICD-10-CM | POA: Diagnosis not present

## 2020-01-25 DIAGNOSIS — R0902 Hypoxemia: Secondary | ICD-10-CM

## 2020-01-25 DIAGNOSIS — I2699 Other pulmonary embolism without acute cor pulmonale: Secondary | ICD-10-CM

## 2020-01-25 DIAGNOSIS — R079 Chest pain, unspecified: Secondary | ICD-10-CM

## 2020-01-25 DIAGNOSIS — R000 Tachycardia, unspecified: Secondary | ICD-10-CM

## 2020-01-25 DIAGNOSIS — R778 Other specified abnormalities of plasma proteins: Secondary | ICD-10-CM | POA: Diagnosis present

## 2020-01-25 LAB — TROPONIN I (HIGH SENSITIVITY)
Troponin I (High Sensitivity): 64 ng/L — ABNORMAL HIGH (ref <18)
Troponin I (High Sensitivity): 749 ng/L (ref <18)

## 2020-01-25 LAB — CBC WITH DIFFERENTIAL/PLATELET
Abs Immature Granulocytes: 0.03 10*3/uL (ref 0.00–0.07)
Basophils Absolute: 0 10*3/uL (ref 0.0–0.1)
Basophils Relative: 1 %
Eosinophils Absolute: 0.2 10*3/uL (ref 0.0–0.5)
Eosinophils Relative: 2 %
HCT: 41.6 % (ref 36.0–46.0)
Hemoglobin: 13.3 g/dL (ref 12.0–15.0)
Immature Granulocytes: 0 %
Lymphocytes Relative: 57 %
Lymphs Abs: 5 10*3/uL — ABNORMAL HIGH (ref 0.7–4.0)
MCH: 26.3 pg (ref 26.0–34.0)
MCHC: 32 g/dL (ref 30.0–36.0)
MCV: 82.2 fL (ref 80.0–100.0)
Monocytes Absolute: 0.5 10*3/uL (ref 0.1–1.0)
Monocytes Relative: 6 %
Neutro Abs: 2.9 10*3/uL (ref 1.7–7.7)
Neutrophils Relative %: 34 %
Platelets: 267 10*3/uL (ref 150–400)
RBC: 5.06 MIL/uL (ref 3.87–5.11)
RDW: 13.4 % (ref 11.5–15.5)
WBC: 8.6 10*3/uL (ref 4.0–10.5)
nRBC: 0 % (ref 0.0–0.2)

## 2020-01-25 LAB — COMPREHENSIVE METABOLIC PANEL
ALT: 21 U/L (ref 0–44)
AST: 24 U/L (ref 15–41)
Albumin: 3.7 g/dL (ref 3.5–5.0)
Alkaline Phosphatase: 65 U/L (ref 38–126)
Anion gap: 11 (ref 5–15)
BUN: 13 mg/dL (ref 6–20)
CO2: 25 mmol/L (ref 22–32)
Calcium: 9 mg/dL (ref 8.9–10.3)
Chloride: 102 mmol/L (ref 98–111)
Creatinine, Ser: 1.23 mg/dL — ABNORMAL HIGH (ref 0.44–1.00)
GFR, Estimated: 55 mL/min — ABNORMAL LOW (ref >60)
Glucose, Bld: 128 mg/dL — ABNORMAL HIGH (ref 70–99)
Potassium: 3.4 mmol/L — ABNORMAL LOW (ref 3.5–5.1)
Sodium: 138 mmol/L (ref 135–145)
Total Bilirubin: 0.5 mg/dL (ref 0.3–1.2)
Total Protein: 7.7 g/dL (ref 6.5–8.1)

## 2020-01-25 LAB — BRAIN NATRIURETIC PEPTIDE: B Natriuretic Peptide: 37.6 pg/mL (ref 0.0–100.0)

## 2020-01-25 LAB — RESPIRATORY PANEL BY RT PCR (FLU A&B, COVID)
Influenza A by PCR: NEGATIVE
Influenza B by PCR: NEGATIVE
SARS Coronavirus 2 by RT PCR: NEGATIVE

## 2020-01-25 LAB — GLUCOSE, CAPILLARY: Glucose-Capillary: 114 mg/dL — ABNORMAL HIGH (ref 70–99)

## 2020-01-25 LAB — MRSA PCR SCREENING: MRSA by PCR: NEGATIVE

## 2020-01-25 MED ORDER — SODIUM CHLORIDE 0.9 % IV SOLN: Freq: Once | INTRAVENOUS | Status: DC

## 2020-01-25 MED ORDER — HEPARIN BOLUS VIA INFUSION
7250.0000 [IU] | Freq: Once | INTRAVENOUS | Status: AC
  Administered 2020-01-25: 7250 [IU] via INTRAVENOUS

## 2020-01-25 MED ORDER — PANTOPRAZOLE SODIUM 40 MG PO TBEC
40.0000 mg | DELAYED_RELEASE_TABLET | Freq: Every day | ORAL | Status: DC
  Administered 2020-01-26 – 2020-01-28 (×3): 40 mg via ORAL
  Filled 2020-01-25 (×3): qty 1

## 2020-01-25 MED ORDER — POLYETHYLENE GLYCOL 3350 17 G PO PACK: 17.0000 g | PACK | Freq: Every day | ORAL | Status: DC | PRN

## 2020-01-25 MED ORDER — IOHEXOL 350 MG/ML SOLN
100.0000 mL | Freq: Once | INTRAVENOUS | Status: AC | PRN
  Administered 2020-01-25: 100 mL via INTRAVENOUS

## 2020-01-25 MED ORDER — DOCUSATE SODIUM 100 MG PO CAPS: 100.0000 mg | ORAL_CAPSULE | Freq: Two times a day (BID) | ORAL | Status: DC | PRN

## 2020-01-25 MED ORDER — HEPARIN (PORCINE) 25000 UT/250ML-% IV SOLN
1600.0000 [IU]/h | INTRAVENOUS | Status: AC
  Administered 2020-01-25 – 2020-01-27 (×3): 1650 [IU]/h via INTRAVENOUS
  Filled 2020-01-25 (×4): qty 250

## 2020-01-25 MED ORDER — CHLORHEXIDINE GLUCONATE CLOTH 2 % EX PADS
6.0000 | MEDICATED_PAD | Freq: Every day | CUTANEOUS | Status: DC
  Administered 2020-01-26 – 2020-01-27 (×2): 6 via TOPICAL

## 2020-01-25 NOTE — ED Notes (Signed)
Attempted ultrasound IV x 1. Good blood return although not able to fully cannulate vein. Patient tolerated well.

## 2020-01-25 NOTE — ED Notes (Signed)
Iv attempted to left hand, tol well, unsuccessful.

## 2020-01-25 NOTE — ED Notes (Signed)
IV attempt x 1 left A/C unsuccessful.

## 2020-01-25 NOTE — Progress Notes (Signed)
eLink Physician-Brief Progress Note Patient Name: [name] DOB: 10-14-1975 MRN: [id number]   Date of Service  01/25/2020  HPI/Events of Note  44 yr old female wit hx of GERD, HLD, Obesity Shifted from OSH for Hemodynamically stable PE. Patient with sudden onset CP, SOB with recently leg immobilzation has large PE with right heart strain. Heparin started, ICU consulted, will work to expedite admission for possible catheter directed thrombolytics, thrombectomy. Plan tPA if decompensates.  Camera: Sinus tachy on nasal o2.  SBP 150. Comfortable. Obese. On heparine gtt. sats 100%.   Data:'reviewed. Elevated tropo 749 up from 64.  Cr 1.23,   eICU Interventions  - notified ground ICU team. - continue care - if decompensates need tPA/EKOS. (possibly need EKOS soon for rt heart strain/elevated troponin and persisting sinus tachy > 150- sub massive PE).     Intervention Category Major Interventions: Other: Evaluation Type: New Patient Evaluation  Ranee Gosselin 01/25/2020, 9:48 PM

## 2020-01-25 NOTE — ED Notes (Signed)
IV attempt right unsuccessful.

## 2020-01-25 NOTE — H&P (Addendum)
NAME:  [name], MRN:  [id number], DOB:  March 10, 1976, LOS: 0 ADMISSION DATE:  01/25/2020, CONSULTATION DATE:  01/25/2020 REFERRING MD: Lyndel Safe, PA, CHIEF COMPLAINT:  SOB/ CP  Brief History   44 year old female who presented with acute onset of chest pain and shortness of breath with recent 11 week hx of left lower extremity immobilization secondary to Achilles tear found to be tachycardic, tachypneic, and hypoxic 90% on RA with CTA PE showing multiple proximal occlusive PE's with LV/ RV ratio 1.5.  Blood pressure normotensive.  Started on heparin gtt and transferred to [facility] for further care and evaluation.   History of present illness    44 year old female with prior history of HLD (not on meds, working on diet/exercise), morbid obesity, and GERD who presented to [facility] ER 10/23 who was in her normal state of health who had acute onset of substernal non-radiating chest tightness and shortness of breath while walking to her car today this morning.  Due to achilles tear, she has been in a cam walker for 11 weeks.  She additionally works from home and has prolonged sitting episodes.  Several days ago, she noticed some swelling in her left calf but denies any pain or tenderness; is in transition out of her cam walker and told by her PT that intermittent swelling was normal.  No previous history of DVT/ PE, no recent trips, she is s/p hysterectomy, and not taking any anticoagulants or antiplatelets.  No prior personal history or family history of clotting disorders.  Denies any dizziness or near syncopal episodes.   In ER, she was tachycardic in the 140's, tachypneic, blood pressure 144/80, but hypoxic at 90% on room air, improved on 2L [place].  Workup noted for sCr 1.23,  troponin hs 64-> 749, BNP 37, CXR no acute process, COVID neg, EKG showed sinus tachycardia and borderline prolonged QTc.  CTA PE showed multiple proximal occlusive PE's with LV/ RV ratio 1.5.  She was started on heparin gtt  and to be transferred to [facility], accepted by [facility] for close ICU observation, evaluation, and care.    Past Medical History  HLD, morbid obesity, GERD  Significant Hospital Events   10/23 [facility] ER -> [facility]  Consults:   Procedures:   Significant Diagnostic Tests:  01/25/2020 CTA PE >> 1. Bilateral proximal occlusive pulmonary thromboemboli. Overall clot burden is severe. 2. Evidence of RIGHT ventricular strain with RV to LV ratio equal 1.5. 3. No pulmonary infarction.  Micro Data:  10/23 SARS 2/ flu >> neg  Antimicrobials:  n/a  Interim history/subjective:  Feels better, no chest tightness, SOB better. On 2L [place] at 100%  Objective   Blood pressure (!) 144/80, pulse (!) 141, temperature 98 F (36.7 C), temperature source Oral, resp. rate (!) 21, height 5\' 10"  (1.778 m), weight (!) 145.2 kg, SpO2 100 %.       No intake or output data in the 24 hours ending 01/25/20 1930 Filed Weights   01/25/20 1639  Weight: (!) 145.2 kg   Examination: General:  Adult female in NAD sitting in bed HEENT: MM pink/moist Neuro: AOx 4, MAE CV: ST, no murmur PULM:  Non labored, CTA, normal respirations GI: obese, +bs, soft Extremities: warm/dry, no obvious LE edema/ tenderness Skin: no rashes  Resolved Hospital Problem list    Assessment & Plan:   Acute bilateral submassive PE Hypoxic respiratory failure related to acute PE -Bilateral proximal occlusive pulmonary thromboemboli with large clot burden without evidence  of pulmonary infarction, with evidence of right heart strain, RV/ LV ratio 1.5, bumped troponin hs/ normal BNP P:  Admit to ICU  Discussed with attending, IR consulted given large clot burden, spoke with Dr. Deanne Coffer who will review images and call back with recommendations. Addendum: IR will see first thing in the morning.   No contraindications to tPA, consider lytics if she decompensates Heparin gtt per pharmacy Continue supplemental O2 for sat goal > 94% Goal MAP > 65 TTE  in am  Bilateral venous duplex in am to assess for DVTs Trend troponin hs 64-> 749-> Bedrest    Mild AKI P:  Gentle hydration Trend renal indices/ strict I/O's   Borderline prolonged QTc, .503 P:  Tele monitoring Avoid QTc prolonging meds  GERD P:  Continue home omeprazole   Achilles Tendon tear P:  Per outpt ortho   HLD P:  Not on home meds   Morbid obesity, BMI 45.92 kg/m2   Best practice:  Diet: NPO for now Pain/Anxiety/Delirium protocol (if indicated): n/a VAP protocol (if indicated): n/a DVT prophylaxis: Heparin gtt GI prophylaxis: n/a Glucose control: trend on BMET Mobility: BR Code Status: Full  Family Communication: patient updated on plan of care Disposition: ICU   Labs   CBC: Recent Labs  Lab 01/25/20 1647  WBC 8.6  NEUTROABS 2.9  HGB 13.3  HCT 41.6  MCV 82.2  PLT 267    Basic Metabolic Panel: Recent Labs  Lab 01/25/20 1647  NA 138  K 3.4*  CL 102  CO2 25  GLUCOSE 128*  BUN 13  CREATININE 1.23*  CALCIUM 9.0   GFR: Estimated Creatinine Clearance: 90.5 mL/min (A) (by C-G formula based on SCr of 1.23 mg/dL (H)). Recent Labs  Lab 01/25/20 1647  WBC 8.6    Liver Function Tests: Recent Labs  Lab 01/25/20 1647  AST 24  ALT 21  ALKPHOS 65  BILITOT 0.5  PROT 7.7  ALBUMIN 3.7   No results for input(s): LIPASE, AMYLASE in the last 168 hours. No results for input(s): AMMONIA in the last 168 hours.  ABG No results found for: PHART, PCO2ART, PO2ART, HCO3, TCO2, ACIDBASEDEF, O2SAT   Coagulation Profile: No results for input(s): INR, PROTIME in the last 168 hours.  Cardiac Enzymes: No results for input(s): CKTOTAL, CKMB, CKMBINDEX, TROPONINI in the last 168 hours.  HbA1C: No results found for: HGBA1C  CBG: No results for input(s): GLUCAP in the last 168 hours.  Review of Systems:   Review of Systems  Constitutional: Negative for chills and fever.  Respiratory: Positive for shortness of breath. Negative for cough,  hemoptysis and sputum production.   Cardiovascular: Positive for chest pain and leg swelling. Negative for palpitations and orthopnea.  Gastrointestinal: Negative for abdominal pain, blood in stool, melena, nausea and vomiting.  Genitourinary: Negative for hematuria.  Neurological: Negative for dizziness, focal weakness, loss of consciousness and weakness.   Past Medical History  She,  has a past medical history of Acid reflux.   Surgical History    Past Surgical History:  Procedure Laterality Date  . ABDOMINAL HYSTERECTOMY    . LAPAROTOMY N/A 06/26/2015   Procedure: EXPLORATORY LAPAROTOMY, LYSIS OF ADHESIONS;  Surgeon: Darnell Level, MD;  Location: [facility] ORS;  Service: General;  Laterality: N/A;     Social History   reports that she has never smoked. She has never used smokeless tobacco. She reports that she does not drink alcohol and does not use drugs.   Family History   Her  family history is not on file.   Allergies No Known Allergies   Home Medications  Prior to Admission medications   Medication Sig Start Date End Date Taking? Authorizing Provider  Multiple Vitamin (MULTIVITAMIN WITH MINERALS) TABS tablet Take 1 tablet by mouth daily.    [provider]  omeprazole (PRILOSEC) 20 MG capsule Take 1 capsule (20 mg total) by mouth daily. 02/22/15   Barrett, Rolm Gala, PA-C  ondansetron (ZOFRAN-ODT) 4 MG disintegrating tablet Take 4 mg by mouth every 4 (four) hours as needed. For nausea 06/17/15   [provider]  oxyCODONE-acetaminophen (PERCOCET/ROXICET) 5-325 MG tablet Take 1-2 tablets by mouth every 4 (four) hours as needed for moderate pain. 07/03/15   Riebock, Anette Riedel, NP  phentermine (ADIPEX-P) 37.5 MG tablet Take 37.5 mg by mouth daily. 05/30/15   [provider]     Critical care time: 40 mins     Posey Boyer, ACNP [facility] 01/25/2020, 10:20 PM  See Amion for personal pager PCCM on call pager [phone]

## 2020-01-25 NOTE — ED Notes (Signed)
Taken to CT at this time. 

## 2020-01-25 NOTE — Progress Notes (Signed)
ANTICOAGULATION CONSULT NOTE  Pharmacy Consult for Heparin Indication: pulmonary embolus  No Known Allergies  Patient Measurements: Height: 5\' 10"  (177.8 cm) Weight: (!) 145.2 kg (320 lb) IBW/kg (Calculated) : 68.5 Heparin Dosing Weight: 103.5 kg   Vital Signs: Temp: 98 F (36.7 C) (10/23 1640) Temp Source: Oral (10/23 1640) BP: 144/80 (10/23 1644) Pulse Rate: 141 (10/23 1644)  Labs: Recent Labs    01/25/20 1647  HGB 13.3  HCT 41.6  PLT 267  CREATININE 1.23*  TROPONINIHS 64*    Estimated Creatinine Clearance: 90.5 mL/min (A) (by C-G formula based on SCr of 1.23 mg/dL (H)).   Medical History: Past Medical History:  Diagnosis Date  . Acid reflux     Medications:  Scheduled:  . heparin  7,250 Units Intravenous Once    Assessment: Patient is a 58 yof that presented to the ED with SOB. On evaluation patient was found to have bilateral  PEs. Pharmacy has been asked to dose heparin at this time.  Goal of Therapy:  Heparin level 0.3-0.7 units/ml Monitor platelets by anticoagulation protocol: Yes   Plan:  - Heparin bolus 7250 units IV x 1 dose - Heparin drip @ 1650 units/hr - Heparin level in ~ 6 hours  - Monitor patient for s/s of bleeding and CBC while on heparin   54 PharmD. BCPS  01/25/2020,6:11 PM

## 2020-01-25 NOTE — ED Triage Notes (Addendum)
Pt presents to triage with c/o sudden onset SOB today while walking to her car. She had 2nd dose covid vaccine in July. Denies recent travel. States she injured her left achilles tendon in August and has been in therapy (she is wearing a cam walker on left foot. Co sudden onset chest pressure when she became SOB. O2 sats 88-90% on room air upon arrival

## 2020-01-25 NOTE — ED Notes (Signed)
Dr. Bernette Mayers aware troponin is 749.

## 2020-01-25 NOTE — ED Provider Notes (Signed)
[facility] EMERGENCY DEPARTMENT Provider Note   CSN: [id number] Arrival date & time: 01/25/20  1630     History Chief Complaint  Patient presents with  . Shortness of Breath    [name] is a 44 y.o. female with past medical history of hysterectomy, obesity, hyperlipidemia, who presents today for evaluation of sudden onset chest pain substernal with associated shortness of breath.  Pain radiates into her back.  Does not radiate down her arm or up into her neck.  She has been wearing a Cam walker on her left lower extremity for the past 11 weeks due to an Achilles rupture.  She did not have surgery.  She does not take any anticoagulants or blood thinning medications.  No antiplatelets.  She states that she was feeling well prior to the sudden onset of her pain.  She does not have a history of prior DVT/PE, denies any increased pain in her left leg recently.  She denies any baseline asthma.  She shows me on her apple watch it appears that her heart rate has been in the 80s most of the day however suddenly jumped up into the 140s. HPI     Past Medical History:  Diagnosis Date  . Acid reflux     Patient Active Problem List   Diagnosis Date Noted  . Pulmonary embolism with acute cor pulmonale (HCC) 01/25/2020  . SBO (small bowel obstruction)  06/26/2015  . Small bowel obstruction (HCC) 06/26/2015  . Acid reflux   . Chest wall pain 03/18/2015  . Anxiety, generalized 02/25/2015  . Major depressive disorder 02/25/2015  . Encounter for other specified aftercare 12/24/2014  . 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 Avitaminosis D 03/04/2014  . Back ache 02/07/2014  . HLD (hyperlipidemia) 06/03/2011  . Morbid obesity (HCC) 06/03/2011    Past Surgical History:  Procedure Laterality Date  . ABDOMINAL HYSTERECTOMY    . LAPAROTOMY N/A 06/26/2015   Procedure: EXPLORATORY LAPAROTOMY, LYSIS OF ADHESIONS;  Surgeon: Darnell Level, MD;  Location: [facility] ORS;  Service: General;   Laterality: N/A;     OB History   No obstetric history on file.     No family history on file.  Social History   Tobacco Use  . Smoking status: Never Smoker  . Smokeless tobacco: Never Used  Substance Use Topics  . Alcohol use: No  . Drug use: No    Home Medications Prior to Admission medications   Medication Sig Start Date End Date Taking? Authorizing Provider  Multiple Vitamin (MULTIVITAMIN WITH MINERALS) TABS tablet Take 1 tablet by mouth daily.    [provider]  omeprazole (PRILOSEC) 20 MG capsule Take 1 capsule (20 mg total) by mouth daily. 02/22/15   Barrett, Rolm Gala, PA-C  ondansetron (ZOFRAN-ODT) 4 MG disintegrating tablet Take 4 mg by mouth every 4 (four) hours as needed. For nausea 06/17/15   [provider]  oxyCODONE-acetaminophen (PERCOCET/ROXICET) 5-325 MG tablet Take 1-2 tablets by mouth every 4 (four) hours as needed for moderate pain. 07/03/15   Riebock, Anette Riedel, NP  phentermine (ADIPEX-P) 37.5 MG tablet Take 37.5 mg by mouth daily. 05/30/15   [provider]    Allergies    Patient has no known allergies.  Review of Systems   Review of Systems  Constitutional: Negative for chills and fever.  HENT: Negative for congestion.   Respiratory: Positive for cough, chest tightness and shortness of breath.   Cardiovascular: Positive for chest pain. Negative for palpitations and leg swelling.  Gastrointestinal: Negative for abdominal pain, diarrhea, nausea and vomiting.  Genitourinary: Negative for dysuria and urgency.  Musculoskeletal: Positive for back pain. Negative for neck pain.  Neurological: Negative for weakness, numbness and headaches.  All other systems reviewed and are negative.   Physical Exam Updated Vital Signs BP (!) 144/80   Pulse (!) 141   Temp 98 F (36.7 C) (Oral)   Resp (!) 21   Ht 5\' 10"  (1.778 m)   Wt (!) 145.2 kg   SpO2 100%   BMI 45.92 kg/m   Physical Exam Vitals and nursing note reviewed.    Constitutional:      General: She is not in acute distress.    Appearance: She is well-developed.  HENT:     Head: Normocephalic and atraumatic.  Eyes:     Conjunctiva/sclera: Conjunctivae normal.  Cardiovascular:     Rate and Rhythm: Regular rhythm. Tachycardia present.     Pulses: Normal pulses.     Heart sounds: No murmur heard.      Comments: 2+ DP, PT pulses bilaterally.  2+ radial pulses bilaterally. Pulmonary:     Effort: Tachypnea and respiratory distress (Mild,) present.     Breath sounds: No decreased breath sounds, wheezing, rhonchi or rales.     Comments: Sitting upright Chest:     Chest wall: No deformity, tenderness or crepitus.  Abdominal:     Palpations: Abdomen is soft.     Tenderness: There is no abdominal tenderness.  Musculoskeletal:     Cervical back: Neck supple.     Right lower leg: No tenderness. No edema.     Left lower leg: No tenderness. No edema.     Comments: Cam walker on left lower leg is removed, no calf pain tenderness to palpation.  No obvious swelling.  Skin:    General: Skin is warm and dry.  Neurological:     General: No focal deficit present.     Mental Status: She is alert and oriented to person, place, and time.     Cranial Nerves: No cranial nerve deficit.  Psychiatric:        Mood and Affect: Mood normal.        Behavior: Behavior normal.     ED Results / Procedures / Treatments   Labs (all labs ordered are listed, but only abnormal results are displayed) Labs Reviewed  COMPREHENSIVE METABOLIC PANEL - Abnormal; Notable for the following components:      Result Value   Potassium 3.4 (*)    Glucose, Bld 128 (*)    Creatinine, Ser 1.23 (*)    GFR, Estimated 55 (*)    All other components within normal limits  CBC WITH DIFFERENTIAL/PLATELET - Abnormal; Notable for the following components:   Lymphs Abs 5.0 (*)    All other components within normal limits  TROPONIN I (HIGH SENSITIVITY) - Abnormal; Notable for the following  components:   Troponin I (High Sensitivity) 64 (*)    All other components within normal limits  RESPIRATORY PANEL BY RT PCR (FLU A&B, COVID)  BRAIN NATRIURETIC PEPTIDE  HEPARIN LEVEL (UNFRACTIONATED)  TROPONIN I (HIGH SENSITIVITY)    EKG EKG Interpretation  Date/Time:  Saturday January 25 2020 16:47:34 EDT Ventricular Rate:  141 PR Interval:    QRS Duration: 71 QT Interval:  328 QTC Calculation: 503 R Axis:   88 Text Interpretation: Sinus tachycardia Probable left atrial enlargement Borderline repolarization abnormality Borderline prolonged QT interval No significant change since last tracing Confirmed by 11[birth year]10[birth year]1970,  Leonette Most [phone]) on 01/25/2020 4:51:11 PM   Radiology CT Angio Chest PE W and/or Wo Contrast  Result Date: 01/25/2020 CLINICAL DATA:  Sudden onset chest pain. Tachycardia. Patient improved for Achilles tendon tear EXAM: CT ANGIOGRAPHY CHEST WITH CONTRAST TECHNIQUE: Multidetector CT imaging of the chest was performed using the standard protocol during bolus administration of intravenous contrast. Multiplanar CT image reconstructions and MIPs were obtained to evaluate the vascular anatomy. CONTRAST:  OMNIPAQUE IOHEXOL 350 MG/ML SOLN COMPARISON:  Is 6 FINDINGS: Cardiovascular: There is bilateral proximal occlusive pulmonary thromboemboli within the LEFT and RIGHT proximal segmental branches of the upper lobes and lower lobes. Overall clot burden is severe with occlusive thrombus within the RIGHT lower lobe and partially occlusive thrombus in the LEFT lower lobe. Partially occlusive thrombus in the upper lobes. The RIGHT ventricular to LEFT ventricular ratio is > 1 (4.2 / 2.8 equals 1.5) indicating RIGHT ventricular strain. Mediastinum/Nodes: No axillary or supraclavicular adenopathy. No mediastinal or hilar adenopathy. No pericardial fluid. Esophagus normal. Lungs/Pleura: No pulmonary infarction. No pleural fluid. Airways normal. Upper Abdomen: Limited view of the liver,  kidneys, pancreas are unremarkable. Normal adrenal glands. Musculoskeletal: No aggressive osseous lesion. Review of the MIP images confirms the above findings. IMPRESSION: 1. Bilateral proximal occlusive pulmonary thromboemboli. Overall clot burden is severe. 2. Evidence of RIGHT ventricular strain with RV to LV ratio equal 1.5. 3. No pulmonary infarction. Critical Value/emergent results were called by telephone at the time of interpretation on 01/25/2020 at 6:29 pm to provider [facility] , who verbally acknowledged these results. Recommend activating submassive PE/RIGHT ventricular strain protocol in EPIC. Electronically Signed   By: Genevive Bi M.D.   On: 01/25/2020 18:31   DG Chest Port 1 View  Result Date: 01/25/2020 CLINICAL DATA:  Acute chest pain and shortness of breath today. EXAM: PORTABLE CHEST 1 VIEW COMPARISON:  02/22/2015 FINDINGS: The cardiomediastinal silhouette is unremarkable. There is no evidence of focal airspace disease, pulmonary edema, suspicious pulmonary nodule/mass, pleural effusion, or pneumothorax. No acute bony abnormalities are identified. IMPRESSION: No active disease. Electronically Signed   By: Harmon Pier M.D.   On: 01/25/2020 17:10    Procedures .Critical Care Performed by: Cristina Gong, PA-C Authorized by: Cristina Gong, PA-C   Critical care provider statement:    Critical care time (minutes):  80   Critical care time was exclusive of:  Separately billable procedures and treating other patients and teaching time   Critical care was necessary to treat or prevent imminent or life-threatening deterioration of the following conditions:  Circulatory failure and respiratory failure   Critical care was time spent personally by me on the following activities:  Discussions with consultants, evaluation of patient's response to treatment, examination of patient, ordering and performing treatments and interventions, ordering and review of laboratory  studies, ordering and review of radiographic studies, pulse oximetry, re-evaluation of patient's condition, obtaining history from patient or surrogate, review of old charts and development of treatment plan with patient or surrogate   I assumed direction of critical care for this patient from another provider in my specialty: no     (including critical care time)  Medications Ordered in ED Medications  heparin ADULT infusion 100 units/mL (25000 units/261mL sodium chloride 0.45%) (1,650 Units/hr Intravenous New Bag/Given 01/25/20 1820)  iohexol (OMNIPAQUE) 350 MG/ML injection 100 mL (100 mLs Intravenous Contrast Given 01/25/20 1749)  heparin bolus via infusion 7,250 Units (7,250 Units Intravenous Bolus from Bag 01/25/20 1827)    ED Course  I have reviewed the triage vital signs and the nursing notes.  Pertinent labs & imaging results that were available during my care of the patient were reviewed by me and considered in my medical decision making (see chart for details).  Clinical Course as of Jan 25 2  Sat Jan 25, 2020  1801 Informed RN of need for second IV, CT scan shows that concern of large PE.  Heparin per pharmacy consult placed.     [EH]  1823 1.9   [EH]  1828 Spoke with radiology who confirmed multiple significant occluding PEs with right heart ratio of 1.9.  Patient has positive troponin.  I have personally paged critical care for PE consult.  Patient has been started on heparin.   [EH]  1831 I spoke with PCCM, per Dr. Tonia BroomsIcard transfer to ICU for overnight observation.     [EH]  1856 Bed assigned, asked RN to get [facility] to bump patient to top of list.    [EH]  1959 Troponin I (High Sensitivity)(!!): 749 [EH]    Clinical Course User Index [EH] Norman ClayHammond, Euriah Matlack W, PA-C   MDM Rules/Calculators/A&P                         Patient is a 44 year old woman who has had immobilization of the left lower extremity for 11 weeks who presents today for evaluation of sudden onset  shortness of breath, chest pain and tachycardia.  On arrival here she is hypoxic at 90% on room air and tachycardic at 103.  She is tachypneic into the 20s between 21 to 28 breaths/min.  Clinically extremely high concern for suspicion given the sudden onset of her symptoms, hypoxia, tachycardia and having a extremity immobilized for the past 11 weeks.  Labs are obtained and reviewed, CBC and CMP shows slight elevation in creatinine at 1.23 however otherwise unremarkable.  BNP is not elevated.  Covid testing is negative.  Chest x-ray shows no active process.  Troponin is elevated at 64, prior for comparison.  EKG shows tachycardia, possibly prolonged QT interval no changes since last tracing.  CTA PE study was expedited, on my review shows concern for multiple proximal occlusive PEs.  I called radiology who confirmed this read.  Patient is started on heparin IV.    I consulted critical care.  I spoke with critical care staff who talked with Dr. Tonia BroomsIcard, given that the CT shows concern for heart strain with a ratio of 1.9, along with elevated troponin, her hypoxia and escalating tachycardia he recommends admission to ICU.  [facility] is made aware, asked for [facility] to bump patient to the top of the transfer list.  Patient is informed of her results and plan.  Patient transferred [facility] by [facility].   Note: Portions of this report may have been transcribed using voice recognition software. Every effort was made to ensure accuracy; however, inadvertent computerized transcription errors may be present  Final Clinical Impression(s) / ED Diagnoses Final diagnoses:  Acute saddle pulmonary embolism with acute cor pulmonale (HCC)  Hypoxia  Tachycardia  Elevated troponin    Rx / DC Orders ED Discharge Orders    None       Norman ClayHammond, Novelle Addair W, PA-C 01/26/20 0004    Pollyann SavoySheldon, Charles B, MD 01/26/20 1300

## 2020-01-25 NOTE — ED Notes (Signed)
ED Provider at bedside. 

## 2020-01-25 NOTE — ED Provider Notes (Signed)
Patient seen and examined, agree with assessment and plan by APP. Patient with sudden onset CP, SOB with recently leg immobilzation has large PE with right heart strain. Heparin started, ICU consulted, will work to expedite admission for possible catheter directed thrombolytics, thrombectomy. Plan tPA if decompensates.    Pollyann Savoy, MD 01/25/20 1910

## 2020-01-26 ENCOUNTER — Inpatient Hospital Stay (HOSPITAL_COMMUNITY): Payer: 59

## 2020-01-26 ENCOUNTER — Encounter (HOSPITAL_COMMUNITY): Payer: 59

## 2020-01-26 DIAGNOSIS — I2602 Saddle embolus of pulmonary artery with acute cor pulmonale: Secondary | ICD-10-CM

## 2020-01-26 DIAGNOSIS — I2609 Other pulmonary embolism with acute cor pulmonale: Secondary | ICD-10-CM | POA: Diagnosis not present

## 2020-01-26 HISTORY — PX: IR ANGIOGRAM PULMONARY BILATERAL SELECTIVE: IMG664

## 2020-01-26 HISTORY — PX: IR US GUIDE VASC ACCESS RIGHT: IMG2390

## 2020-01-26 HISTORY — PX: IR INFUSION THROMBOL ARTERIAL INITIAL (MS): IMG5376

## 2020-01-26 HISTORY — PX: IR ANGIOGRAM SELECTIVE EACH ADDITIONAL VESSEL: IMG667

## 2020-01-26 LAB — CBC
HCT: 41.2 % (ref 36.0–46.0)
HCT: 40.6 % (ref 36.0–46.0)
HCT: 38.8 % (ref 36.0–46.0)
Hemoglobin: 13.2 g/dL (ref 12.0–15.0)
Hemoglobin: 12.9 g/dL (ref 12.0–15.0)
Hemoglobin: 12.5 g/dL (ref 12.0–15.0)
MCH: 26.1 pg (ref 26.0–34.0)
MCH: 26.1 pg (ref 26.0–34.0)
MCH: 26.7 pg (ref 26.0–34.0)
MCHC: 32 g/dL (ref 30.0–36.0)
MCHC: 31.8 g/dL (ref 30.0–36.0)
MCHC: 32.2 g/dL (ref 30.0–36.0)
MCV: 81.4 fL (ref 80.0–100.0)
MCV: 82.2 fL (ref 80.0–100.0)
MCV: 82.7 fL (ref 80.0–100.0)
Platelets: 271 10*3/uL (ref 150–400)
Platelets: 247 10*3/uL (ref 150–400)
Platelets: 207 10*3/uL (ref 150–400)
RBC: 5.06 MIL/uL (ref 3.87–5.11)
RBC: 4.94 MIL/uL (ref 3.87–5.11)
RBC: 4.69 MIL/uL (ref 3.87–5.11)
RDW: 13.4 % (ref 11.5–15.5)
RDW: 13.4 % (ref 11.5–15.5)
RDW: 13.5 % (ref 11.5–15.5)
WBC: 8.7 10*3/uL (ref 4.0–10.5)
WBC: 8 10*3/uL (ref 4.0–10.5)
WBC: 9.4 10*3/uL (ref 4.0–10.5)
nRBC: 0 % (ref 0.0–0.2)
nRBC: 0 % (ref 0.0–0.2)
nRBC: 0 % (ref 0.0–0.2)

## 2020-01-26 LAB — TROPONIN I (HIGH SENSITIVITY): Troponin I (High Sensitivity): 1136 ng/L (ref <18)

## 2020-01-26 LAB — HEPARIN LEVEL (UNFRACTIONATED)
Heparin Unfractionated: 0.43 IU/mL (ref 0.30–0.70)
Heparin Unfractionated: 0.64 IU/mL (ref 0.30–0.70)
Heparin Unfractionated: 0.61 IU/mL (ref 0.30–0.70)

## 2020-01-26 LAB — FIBRINOGEN
Fibrinogen: 510 mg/dL — ABNORMAL HIGH (ref 210–475)
Fibrinogen: 456 mg/dL (ref 210–475)

## 2020-01-26 LAB — ECHOCARDIOGRAM COMPLETE
Height: 70 in
S' Lateral: 2.3 cm
Weight: 5065.29 [oz_av]

## 2020-01-26 LAB — BASIC METABOLIC PANEL
Anion gap: 13 (ref 5–15)
BUN: 8 mg/dL (ref 6–20)
CO2: 21 mmol/L — ABNORMAL LOW (ref 22–32)
Calcium: 9.4 mg/dL (ref 8.9–10.3)
Chloride: 105 mmol/L (ref 98–111)
Creatinine, Ser: 0.97 mg/dL (ref 0.44–1.00)
GFR, Estimated: >60 mL/min (ref >60)
Glucose, Bld: 116 mg/dL — ABNORMAL HIGH (ref 70–99)
Potassium: 3.6 mmol/L (ref 3.5–5.1)
Sodium: 139 mmol/L (ref 135–145)

## 2020-01-26 LAB — HIV ANTIBODY (ROUTINE TESTING W REFLEX): HIV Screen 4th Generation wRfx: NONREACTIVE

## 2020-01-26 MED ORDER — ACETAMINOPHEN 325 MG PO TABS
650.0000 mg | ORAL_TABLET | Freq: Four times a day (QID) | ORAL | Status: DC | PRN
  Administered 2020-01-26 – 2020-01-27 (×3): 650 mg via ORAL
  Filled 2020-01-26 (×3): qty 2

## 2020-01-26 MED ORDER — LIDOCAINE HCL 1 % IJ SOLN
INTRAMUSCULAR | Status: AC
  Filled 2020-01-26: qty 20

## 2020-01-26 MED ORDER — IOHEXOL 300 MG/ML  SOLN
100.0000 mL | Freq: Once | INTRAMUSCULAR | Status: AC | PRN
  Administered 2020-01-26: 50 mL via INTRA_ARTERIAL

## 2020-01-26 MED ORDER — LABETALOL HCL 5 MG/ML IV SOLN
5.0000 mg | INTRAVENOUS | Status: DC | PRN
  Administered 2020-01-27: 5 mg via INTRAVENOUS
  Filled 2020-01-26: qty 4

## 2020-01-26 MED ORDER — FENTANYL CITRATE (PF) 100 MCG/2ML IJ SOLN
INTRAMUSCULAR | Status: AC
  Filled 2020-01-26: qty 2

## 2020-01-26 MED ORDER — SODIUM CHLORIDE 0.9 % IV SOLN
INTRAVENOUS | Status: AC | PRN
  Administered 2020-01-26: 10 mL/h via INTRAVENOUS

## 2020-01-26 MED ORDER — MIDAZOLAM HCL 2 MG/2ML IJ SOLN
INTRAMUSCULAR | Status: AC | PRN
  Administered 2020-01-26: 1 mg via INTRAVENOUS

## 2020-01-26 MED ORDER — SODIUM CHLORIDE 0.9 % IV SOLN: INTRAVENOUS | Status: DC

## 2020-01-26 MED ORDER — PERFLUTREN LIPID MICROSPHERE
1.0000 mL | INTRAVENOUS | Status: AC | PRN
  Administered 2020-01-26: 1.5 mL via INTRAVENOUS
  Filled 2020-01-26: qty 10

## 2020-01-26 MED ORDER — MIDAZOLAM HCL 2 MG/2ML IJ SOLN
INTRAMUSCULAR | Status: AC
  Filled 2020-01-26: qty 2

## 2020-01-26 MED ORDER — SODIUM CHLORIDE 0.9 % IV SOLN
12.0000 mg | INTRAVENOUS | Status: AC
  Administered 2020-01-26: 12 mg via INTRAVENOUS
  Filled 2020-01-26: qty 12

## 2020-01-26 MED ORDER — SODIUM CHLORIDE 0.9 % IV SOLN: 250.0000 mL | INTRAVENOUS | Status: DC | PRN

## 2020-01-26 MED ORDER — SODIUM CHLORIDE 0.9% FLUSH: 3.0000 mL | INTRAVENOUS | Status: DC | PRN

## 2020-01-26 MED ORDER — SODIUM CHLORIDE 0.9% FLUSH
3.0000 mL | Freq: Two times a day (BID) | INTRAVENOUS | Status: DC
  Administered 2020-01-26 – 2020-01-28 (×4): 3 mL via INTRAVENOUS

## 2020-01-26 MED ORDER — LIDOCAINE HCL (PF) 1 % IJ SOLN
INTRAMUSCULAR | Status: AC | PRN
  Administered 2020-01-26: 5 mL

## 2020-01-26 MED ORDER — FENTANYL CITRATE (PF) 100 MCG/2ML IJ SOLN
INTRAMUSCULAR | Status: AC | PRN
Start: 2020-01-26 — End: 2020-01-26
  Administered 2020-01-26: 50 ug via INTRAVENOUS

## 2020-01-26 NOTE — Sedation Documentation (Signed)
RPAP 48/21 (32)

## 2020-01-26 NOTE — Progress Notes (Signed)
CRITICAL VALUE ALERT  Critical Value:  Troponin 1,136  Date & Time Notied:  01/26/20 @0300   Provider Notified: MD  Will continue to monitor, trending up.   Pola Corn

## 2020-01-26 NOTE — Progress Notes (Signed)
ANTICOAGULATION CONSULT NOTE  Pharmacy Consult for Heparin Indication: pulmonary embolus with catheter-directed thrombolysis  No Known Allergies  Patient Measurements: Height: 5\' 10"  (177.8 cm) Weight: (!) 143.6 kg (316 lb 9.3 oz) IBW/kg (Calculated) : 68.5 Heparin Dosing Weight: 103.5 kg   Vital Signs: Temp: 98 F (36.7 C) (10/24 2000) Temp Source: Oral (10/24 2000) BP: 159/97 (10/24 2115) Pulse Rate: 109 (10/24 2115)  Labs: Recent Labs    01/25/20 1647 01/25/20 1647 01/25/20 1902 01/26/20 0152 01/26/20 1400 01/26/20 1941  HGB 13.3   < >  --  13.2 12.9  --   HCT 41.6  --   --  41.2 40.6  --   PLT 267  --   --  271 247  --   HEPARINUNFRC  --   --   --  0.43 0.64 0.61  CREATININE 1.23*  --   --  0.97  --   --   TROPONINIHS 64*  --  749* 1,136*  --   --    < > = values in this interval not displayed.    Estimated Creatinine Clearance: 113.9 mL/min (by C-G formula based on SCr of 0.97 mg/dL).  Assessment: 44 y.o. female with large submassive central bilateral pulmonary emboli on IV Heparin.  Patient went to IR this AM to start catheter-directed thrombolysis. Patient is on Alteplase 12mg  per catheter x2 catheters infusing at 1mg /hr for 12 hours. Heparin drip is currently running at 1650 units/hr.   Heparin level is 0.61 - stable level during lysis as HL prior to lysis was 0.43. Fibrinogen is 456. H/H is stable. Platelets are within normal limits and stable. Per RN, no overt bleeding noted - catheter site looks good.   Goal of Therapy:  Heparin level 0.3-0.7 units/ml Monitor platelets by anticoagulation protocol: Yes   Plan:  Continue Heparin at current rate of 1650 units/hr. Heparin level and Fibrinogen again in 6 hours as per protocol.  Follow-up alteplase infusion (to end 2300 PM) and IR re-evaluation.   52, PharmD, BCPS,  Clinical Pharmacist Please refer to [facility] for [facility] numbers 01/26/2020,9:28 PM

## 2020-01-26 NOTE — Consult Note (Signed)
Chief Complaint: Patient was seen in consultation today for  Chief Complaint  Patient presents with  . Shortness of Breath   at the request of Dr Marlane Mingle Referring Physician(s): Marlane Mingle  Supervising Physician: Oley Balm  Patient Status: [facility] - In-pt  History of Present Illness: [name] is a 44 y.o. female with h/o recent achilles tendon tear,and prolonged period of relative immobility (cam walker 11 weeks, sedentary lifestyle). Swelling noted several days ago. Came in for chest pain and shortness of breath.  CTA demonstrated large central obstructing PE, RV dilatation. Troponins elevated.  Tachycardia.  Past Medical History:  Diagnosis Date  . Acid reflux   No h/o ulcer, other recent surgery, brain aneurysm, other sites of bleeding.  Past Surgical History:  Procedure Laterality Date  . ABDOMINAL HYSTERECTOMY    . LAPAROTOMY N/A 06/26/2015   Procedure: EXPLORATORY LAPAROTOMY, LYSIS OF ADHESIONS;  Surgeon: Darnell Level, MD;  Location: [facility] ORS;  Service: General;  Laterality: N/A;    Allergies: Patient has no known allergies.  Medications: Prior to Admission medications   Medication Sig Start Date End Date Taking? Authorizing Provider  Multiple Vitamin (MULTIVITAMIN WITH MINERALS) TABS tablet Take 1 tablet by mouth daily.    [provider]  omeprazole (PRILOSEC) 20 MG capsule Take 1 capsule (20 mg total) by mouth daily. 02/22/15   Barrett, Rolm Gala, PA-C  ondansetron (ZOFRAN-ODT) 4 MG disintegrating tablet Take 4 mg by mouth every 4 (four) hours as needed. For nausea 06/17/15   [provider]  oxyCODONE-acetaminophen (PERCOCET/ROXICET) 5-325 MG tablet Take 1-2 tablets by mouth every 4 (four) hours as needed for moderate pain. 07/03/15   Riebock, Anette Riedel, NP  phentermine (ADIPEX-P) 37.5 MG tablet Take 37.5 mg by mouth daily. 05/30/15   [provider]     History reviewed. No pertinent family history.  Social History   Socioeconomic  History  . Marital status: Single    Spouse name: Not on file  . Number of children: Not on file  . Years of education: Not on file  . Highest education level: Not on file  Occupational History  . Not on file  Tobacco Use  . Smoking status: Never Smoker  . Smokeless tobacco: Never Used  Substance and Sexual Activity  . Alcohol use: No  . Drug use: No  . Sexual activity: Not on file  Other Topics Concern  . Not on file  Social History Narrative  . Not on file   Social Determinants of Health   Financial Resource Strain:   . Difficulty of Paying Living Expenses: Not on file  Food Insecurity:   . Worried About Programme researcher, broadcasting/film/video in the Last Year: Not on file  . Ran Out of Food in the Last Year: Not on file  Transportation Needs:   . Lack of Transportation (Medical): Not on file  . Lack of Transportation (Non-Medical): Not on file  Physical Activity:   . Days of Exercise per Week: Not on file  . Minutes of Exercise per Session: Not on file  Stress:   . Feeling of Stress : Not on file  Social Connections:   . Frequency of Communication with Friends and Family: Not on file  . Frequency of Social Gatherings with Friends and Family: Not on file  . Attends Religious Services: Not on file  . Active Member of Clubs or Organizations: Not on file  . Attends Banker Meetings: Not on file  . Marital  Status: Not on file    ECOG Status: 2 - Symptomatic, <50% confined to bed  Review of Systems: A 12 point ROS discussed and pertinent positives are indicated in the HPI above.  All other systems are negative.  Review of Systems  Vital Signs: BP (!) 141/97   Pulse (!) 118   Temp 98.2 F (36.8 C) (Oral)   Resp 15   Ht 5\' 10"  (1.778 m)   Wt (!) 143.6 kg   SpO2 100%   BMI 45.42 kg/m   Physical Exam  Imaging: CT Angio Chest PE W and/or Wo Contrast  Result Date: 01/25/2020 CLINICAL DATA:  Sudden onset chest pain. Tachycardia. Patient improved for Achilles tendon  tear EXAM: CT ANGIOGRAPHY CHEST WITH CONTRAST TECHNIQUE: Multidetector CT imaging of the chest was performed using the standard protocol during bolus administration of intravenous contrast. Multiplanar CT image reconstructions and MIPs were obtained to evaluate the vascular anatomy. CONTRAST:  01/27/2020 OMNIPAQUE IOHEXOL 350 MG/ML SOLN COMPARISON:  Is 6 FINDINGS: Cardiovascular: There is bilateral proximal occlusive pulmonary thromboemboli within the LEFT and RIGHT proximal segmental branches of the upper lobes and lower lobes. Overall clot burden is severe with occlusive thrombus within the RIGHT lower lobe and partially occlusive thrombus in the LEFT lower lobe. Partially occlusive thrombus in the upper lobes. The RIGHT ventricular to LEFT ventricular ratio is > 1 (4.2 / 2.8 equals 1.5) indicating RIGHT ventricular strain. Mediastinum/Nodes: No axillary or supraclavicular adenopathy. No mediastinal or hilar adenopathy. No pericardial fluid. Esophagus normal. Lungs/Pleura: No pulmonary infarction. No pleural fluid. Airways normal. Upper Abdomen: Limited view of the liver, kidneys, pancreas are unremarkable. Normal adrenal glands. Musculoskeletal: No aggressive osseous lesion. Review of the MIP images confirms the above findings. IMPRESSION: 1. Bilateral proximal occlusive pulmonary thromboemboli. Overall clot burden is severe. 2. Evidence of RIGHT ventricular strain with RV to LV ratio equal 1.5. 3. No pulmonary infarction. Critical Value/emergent results were called by telephone at the time of interpretation on 01/25/2020 at 6:29 pm to provider [facility] , who verbally acknowledged these results. Recommend activating submassive PE/RIGHT ventricular strain protocol in EPIC. Electronically Signed   By: [facility] M.D.   On: 01/25/2020 18:31   DG Chest Port 1 View  Result Date: 01/25/2020 CLINICAL DATA:  Acute chest pain and shortness of breath today. EXAM: PORTABLE CHEST 1 VIEW COMPARISON:  02/22/2015  FINDINGS: The cardiomediastinal silhouette is unremarkable. There is no evidence of focal airspace disease, pulmonary edema, suspicious pulmonary nodule/mass, pleural effusion, or pneumothorax. No acute bony abnormalities are identified. IMPRESSION: No active disease. Electronically Signed   By: 02/24/2015 M.D.   On: 01/25/2020 17:10    Labs:  CBC: Recent Labs    01/25/20 1647 01/26/20 0152  WBC 8.6 8.7  HGB 13.3 13.2  HCT 41.6 41.2  PLT 267 271    COAGS: No results for input(s): INR, APTT in the last 8760 hours.  BMP: Recent Labs    01/25/20 1647 01/26/20 0152  NA 138 139  K 3.4* 3.6  CL 102 105  CO2 25 21*  GLUCOSE 128* 116*  BUN 13 8  CALCIUM 9.0 9.4  CREATININE 1.23* 0.97  GFRNONAA 55* >60    LIVER FUNCTION TESTS: Recent Labs    01/25/20 1647  BILITOT 0.5  AST 24  ALT 21  ALKPHOS 65  PROT 7.7  ALBUMIN 3.7    TUMOR MARKERS: No results for input(s): AFPTM, CEA, CA199, CHROMGRNA in the last 8760 hours.  Assessment and Plan:  My impression is that this patient has submassive central bilateral pulmonary emboli with CT and serum evidence of R heart strain. She is an appropriate candidate for PE lysis or embolectomy to reduce RV strain and decrease near and late term complications. Discussed with pt andfamily member  the pathophysiology of pulmonary thromboembolic disease; treatment options including anticoagulation alone, lysis, and embolectomy; anticipated benefits; possible risks and complications including but not limited to bleeding, vessel or heart damage, infection, death, stroke, clot. We discussed technical details of the lysis procedure from IJ  approach. THey seemed to understand. She is motivated to proceed with PA catheter directed lysis today. Accordingly, we will proceed urgently with bilateral catheter directed PE lysis under moderate sedation.   Thank you for this interesting consult.  I greatly enjoyed meeting [name] and look forward to  participating in their care.  A copy of this report was sent to the requesting provider on this date.  Electronically Signed: Durwin Glaze, MD 01/26/2020, 9:07 AM   I spent a total of 40 Minutes    in face to face in clinical consultation, greater than 50% of which was counseling/coordinating care for submassive PE.

## 2020-01-26 NOTE — Progress Notes (Signed)
Echocardiogram 2D Echocardiogram has been performed.  [name] 01/26/2020, 1:36 PM

## 2020-01-26 NOTE — Procedures (Signed)
  Procedure: Bilateral pulmonary arterial thrombolysis (initiated)    EBL:   minimal Complications:  none immediate  See full dictation in [employer].  Thora Lance MD Main # [phone] Pager  [phone] Mobile [phone]

## 2020-01-26 NOTE — Progress Notes (Signed)
ANTICOAGULATION CONSULT NOTE  Pharmacy Consult for Heparin Indication: pulmonary embolus No Known Allergies  Patient Measurements: Height: 5\' 10"  (177.8 cm) Weight: (!) 145.2 kg (320 lb) IBW/kg (Calculated) : 68.5 Heparin Dosing Weight: 103.5 kg   Vital Signs: Temp: 98.1 F (36.7 C) (10/23 2352) Temp Source: Oral (10/23 2352) BP: 133/90 (10/24 0200) Pulse Rate: 119 (10/24 0200)  Labs: Recent Labs    01/25/20 1647 01/25/20 1902 01/26/20 0152  HGB 13.3  --  13.2  HCT 41.6  --  41.2  PLT 267  --  271  HEPARINUNFRC  --   --  0.43  CREATININE 1.23*  --  0.97  TROPONINIHS 64* 749*  --     Estimated Creatinine Clearance: 114.7 mL/min (by C-G formula based on SCr of 0.97 mg/dL).  Assessment: 44 y.o. female with PE for heparin  Goal of Therapy:  Heparin level 0.3-0.7 units/ml Monitor platelets by anticoagulation protocol: Yes   Plan:  Continue Heparin at current rate   Jonaven Hilgers, 52 PharmD. BCPS  01/26/2020,2:47 AM

## 2020-01-26 NOTE — Progress Notes (Signed)
eLink Physician-Brief Progress Note Patient Name: [name] DOB: [birth year] MRN: [id number]   Date of Service  01/26/2020  HPI/Events of Note  troponin jumped from 770 to 1136. tachy improving. watching her for now. MAP stable.  Discussed with RN. Notified Dr Joni Reining , bed side team.   eICU Interventions  IR going to see her at 8AM.     Intervention Category Intermediate Interventions: Diagnostic test evaluation  Ranee Gosselin 01/26/2020, 3:09 AM

## 2020-01-26 NOTE — Progress Notes (Signed)
ANTICOAGULATION CONSULT NOTE  Pharmacy Consult for Heparin Indication: pulmonary embolus with catheter-directed thrombolysis  No Known Allergies  Patient Measurements: Height: 5\' 10"  (177.8 cm) Weight: (!) 143.6 kg (316 lb 9.3 oz) IBW/kg (Calculated) : 68.5 Heparin Dosing Weight: 103.5 kg   Vital Signs: Temp: 98.2 F (36.8 C) (10/24 1242) Temp Source: Oral (10/24 1242) BP: 150/102 (10/24 1445) Pulse Rate: 114 (10/24 1445)  Labs: Recent Labs    01/25/20 1647 01/25/20 1647 01/25/20 1902 01/26/20 0152 01/26/20 1400  HGB 13.3   < >  --  13.2 12.9  HCT 41.6  --   --  41.2 40.6  PLT 267  --   --  271 247  HEPARINUNFRC  --   --   --  0.43 0.64  CREATININE 1.23*  --   --  0.97  --   TROPONINIHS 64*  --  749* 1,136*  --    < > = values in this interval not displayed.    Estimated Creatinine Clearance: 113.9 mL/min (by C-G formula based on SCr of 0.97 mg/dL).  Assessment: 44 y.o. female with large submassive central bilateral pulmonary emboli on IV Heparin.  Patient went to IR this AM to start catheter-directed thrombolysis. Patient is on Alteplase 12mg  per catheter x2 catheters infusing at 1mg /hr for 12 hours. Heparin drip is currently running at 1650 units/hr.   Heparin level is 0.64 - up from last level prior to lysis of 0.43. Fibrinogen is 510. H/H is stable. Platelets are within normal limits and stable. Per RN, no overt bleeding noted - catheter site looks good.   Goal of Therapy:  Heparin level 0.3-0.7 units/ml Monitor platelets by anticoagulation protocol: Yes   Plan:  Continue Heparin at current rate of 1650 units/hr. Heparin level and Fibrinogen again in 6 hours as per protocol.  Follow-up alteplase infusion (to end 2100 PM) and IR re-evaluation.   52, PharmD, BCPS, BCCCP Clinical Pharmacist Please refer to [facility] for [facility] Pharm[facility] numbers 01/26/2020,2:55 PM

## 2020-01-26 NOTE — Progress Notes (Signed)
NAME:  [name], MRN:  [id number], DOB:  December 23, 1975, LOS: 1 ADMISSION DATE:  01/25/2020, CONSULTATION DATE:  01/25/2020 REFERRING MD: Lyndel Safe, PA, CHIEF COMPLAINT:  SOB/ CP  Brief History   44 year old female who presented with acute onset of chest pain and shortness of breath with recent 11 week hx of left lower extremity immobilization secondary to Achilles tear found to be tachycardic, tachypneic, and hypoxic 90% on RA with CTA PE showing multiple proximal occlusive PE's with LV/ RV ratio 1.5.  Blood pressure normotensive.  Started on heparin gtt and transferred to [facility] for further care and evaluation.   History of present illness    44 year old female with prior history of HLD (not on meds, working on diet/exercise), morbid obesity, and GERD who presented to [facility] 10/23 who was in her normal state of health who had acute onset of substernal non-radiating chest tightness and shortness of breath while walking to her car today this morning.  Due to achilles tear, she has been in a cam walker for 11 weeks.  She additionally works from home and has prolonged sitting episodes.  Several days ago, she noticed some swelling in her left calf but denies any pain or tenderness; is in transition out of her cam walker and told by her PT that intermittent swelling was normal.  No previous history of DVT/ PE, no recent trips, she is s/p hysterectomy, and not taking any anticoagulants or antiplatelets.  No prior personal history or family history of clotting disorders.  Denies any dizziness or near syncopal episodes.   In ER, she was tachycardic in the 140's, tachypneic, blood pressure 144/80, but hypoxic at 90% on room air, improved on 2L [place].  Workup noted for sCr 1.23,  troponin hs 64-> 749, BNP 37, CXR no acute process, COVID neg, EKG showed sinus tachycardia and borderline prolonged QTc.  CTA PE showed multiple proximal occlusive PE's with LV/ RV ratio 1.5.  She was started on heparin gtt  and to be transferred to [facility], accepted by [facility] for close ICU observation, evaluation, and care.    Past Medical History  HLD, morbid obesity, GERD  Significant Hospital Events   10/23 [facility] -> [facility]  Consults:   Procedures:   Significant Diagnostic Tests:  01/25/2020 CTA PE >> 1. Bilateral proximal occlusive pulmonary thromboemboli. Overall clot burden is severe. 2. Evidence of RIGHT ventricular strain with RV to LV ratio equal 1.5. 3. No pulmonary infarction.  Micro Data:  10/23 SARS 2/ flu >> neg  Antimicrobials:  n/a  Interim history/subjective:  Monitored in ICU overnight.  Still tachycardic but breathing stable on 2 L.  Objective   Blood pressure (!) 141/97, pulse (!) 118, temperature 98.2 F (36.8 C), temperature source Oral, resp. rate 15, height 5\' 10"  (1.778 m), weight (!) 143.6 kg, SpO2 100 %.        Intake/Output Summary (Last 24 hours) at 01/26/2020 01/28/2020 Last data filed at 01/26/2020 0800 Gross per 24 hour  Intake 220.09 ml  Output 700 ml  Net -479.91 ml   Filed Weights   01/25/20 1639 01/26/20 0600  Weight: (!) 145.2 kg (!) 143.6 kg   Examination: General:  Adult female in NAD sitting in bed HEENT: MM pink/moist Neuro: AOx 4, no focal asymmetry, normal speech CV: Tachycardic, regular PULM:  Non labored, CTA, normal respirations GI: obese, +bs, soft Extremities: warm/dry, no edema Skin: no rashes  Resolved Hospital Problem list    Assessment &  Plan:   Acute bilateral submassive PE, provoked in the setting of immobility Hypoxic respiratory failure related to acute PE -Bilateral proximal occlusive pulmonary thromboemboli with large clot burden without evidence of pulmonary infarction, with evidence of right heart strain, RV/ LV ratio 1.5, bumped troponin hs/ normal BNP P:  continue heparin drip.  Interventional radiology consulted, plan is for embolectomy versus catheter directed lysis today continue supplemental O2 for sat goal > 94% Goal  MAP > 65 will follow her echocardiogram Bilateral venous duplex in am to assess for DVTs Bedrest    Mild AKI P:  Gentle hydration Trend renal indices/ strict I/O's   Borderline prolonged QTc, .503 P:  Tele monitoring Avoid QTc prolonging meds  GERD P:  Continue home omeprazole   Achilles Tendon tear P:  Per outpt ortho   HLD P:  Not on home meds   Morbid obesity, Body mass index is 45.42 kg/m.   Best practice:  Diet: NPO for now Pain/Anxiety/Delirium protocol (if indicated): n/a VAP protocol (if indicated): n/a DVT prophylaxis: Heparin gtt GI prophylaxis: n/a Glucose control: trend on BMET Mobility: BR Code Status: Full  Family Communication: patient updated on plan of care Disposition: ICU   The patient is critically ill with multiple organ systems failure and requires high complexity decision making for assessment and support, frequent evaluation and titration of therapies, application of advanced monitoring technologies and extensive interpretation of multiple databases.   Critical Care Time devoted to patient care services described in this note is 35 minutes. This time reflects time of care of this signee Charlott Holler . This critical care time does not reflect separately billable procedures or procedure time, teaching time or supervisory time of PA/NP/Med student/Med Resident etc but could involve care discussion time.  Mickel Baas Pulmonary and Critical Care Medicine 01/26/2020 9:29 AM  Pager: [phone] After hours pager: [phone]   Labs   CBC: Recent Labs  Lab 01/25/20 1647 01/26/20 0152  WBC 8.6 8.7  NEUTROABS 2.9  --   HGB 13.3 13.2  HCT 41.6 41.2  MCV 82.2 81.4  PLT 267 271    Basic Metabolic Panel: Recent Labs  Lab 01/25/20 1647 01/26/20 0152  NA 138 139  K 3.4* 3.6  CL 102 105  CO2 25 21*  GLUCOSE 128* 116*  BUN 13 8  CREATININE 1.23* 0.97  CALCIUM 9.0 9.4   GFR: Estimated Creatinine Clearance: 113.9  mL/min (by C-G formula based on SCr of 0.97 mg/dL). Recent Labs  Lab 01/25/20 1647 01/26/20 0152  WBC 8.6 8.7    Liver Function Tests: Recent Labs  Lab 01/25/20 1647  AST 24  ALT 21  ALKPHOS 65  BILITOT 0.5  PROT 7.7  ALBUMIN 3.7   No results for input(s): LIPASE, AMYLASE in the last 168 hours. No results for input(s): AMMONIA in the last 168 hours.  ABG No results found for: PHART, PCO2ART, PO2ART, HCO3, TCO2, ACIDBASEDEF, O2SAT   Coagulation Profile: No results for input(s): INR, PROTIME in the last 168 hours.  Cardiac Enzymes: No results for input(s): CKTOTAL, CKMB, CKMBINDEX, TROPONINI in the last 168 hours.  HbA1C: No results found for: HGBA1C  CBG: Recent Labs  Lab 01/25/20 2137  GLUCAP 114*

## 2020-01-27 ENCOUNTER — Inpatient Hospital Stay (HOSPITAL_COMMUNITY): Payer: 59

## 2020-01-27 DIAGNOSIS — I2601 Septic pulmonary embolism with acute cor pulmonale: Secondary | ICD-10-CM | POA: Diagnosis not present

## 2020-01-27 DIAGNOSIS — I2699 Other pulmonary embolism without acute cor pulmonale: Secondary | ICD-10-CM

## 2020-01-27 HISTORY — PX: IR THROMB F/U EVAL ART/VEN FINAL DAY (MS): IMG5379

## 2020-01-27 LAB — FIBRINOGEN
Fibrinogen: 411 mg/dL (ref 210–475)
Fibrinogen: 496 mg/dL — ABNORMAL HIGH (ref 210–475)
Fibrinogen: 473 mg/dL (ref 210–475)

## 2020-01-27 LAB — CBC
HCT: 39 % (ref 36.0–46.0)
HCT: 39.4 % (ref 36.0–46.0)
Hemoglobin: 12.5 g/dL (ref 12.0–15.0)
Hemoglobin: 12.7 g/dL (ref 12.0–15.0)
MCH: 26.2 pg (ref 26.0–34.0)
MCH: 26.6 pg (ref 26.0–34.0)
MCHC: 32.1 g/dL (ref 30.0–36.0)
MCHC: 32.2 g/dL (ref 30.0–36.0)
MCV: 81.8 fL (ref 80.0–100.0)
MCV: 82.4 fL (ref 80.0–100.0)
Platelets: 187 10*3/uL (ref 150–400)
Platelets: 206 10*3/uL (ref 150–400)
RBC: 4.77 MIL/uL (ref 3.87–5.11)
RBC: 4.78 MIL/uL (ref 3.87–5.11)
RDW: 13.5 % (ref 11.5–15.5)
RDW: 13.5 % (ref 11.5–15.5)
WBC: 10.4 10*3/uL (ref 4.0–10.5)
WBC: 9.9 10*3/uL (ref 4.0–10.5)
nRBC: 0 % (ref 0.0–0.2)
nRBC: 0 % (ref 0.0–0.2)

## 2020-01-27 LAB — HEPARIN LEVEL (UNFRACTIONATED)
Heparin Unfractionated: 0.66 IU/mL (ref 0.30–0.70)
Heparin Unfractionated: 0.69 IU/mL (ref 0.30–0.70)

## 2020-01-27 MED ORDER — APIXABAN 5 MG PO TABS
5.0000 mg | ORAL_TABLET | Freq: Two times a day (BID) | ORAL | Status: DC
  Administered 2020-01-27 – 2020-01-28 (×3): 5 mg via ORAL
  Filled 2020-01-27 (×3): qty 1

## 2020-01-27 NOTE — Progress Notes (Signed)
Lower extremity venous has been completed.   Preliminary results in CV Proc.   Blanch Media 01/27/2020 11:18 AM

## 2020-01-27 NOTE — Progress Notes (Signed)
Patient ID: [name], female   DOB: 04-02-1976, 44 y.o.   MRN: [id number]     Chief Complaint: Patient was seen today for follow-up PE thrombolysis  Patient Status: [facility] - In-pt  Subjective: Patient is sitting up and feeling better today.  No reported issues with TPA infusion yesterday.  TPA infusion completed and saline running through infusion catheters this morning.   Objective: Physical Exam: BP 138/89   Pulse (!) 111   Temp 98.6 F (37 C) (Oral)   Resp 16   Ht  (1.778 m)   Wt (!) 145.6 kg   SpO2 98%   BMI 46.06 kg/m   Sitting up in bed, no acute distress Right neck vascular sheaths intact, no bleeding and no hematoma.  Current Facility-Administered Medications:  .  0.9 %  sodium chloride infusion, , Intravenous, Once, Cristina Gong, PA-C .  0.9 %  sodium chloride infusion, 250 mL, Intravenous, PRN, Oley Balm, MD .  0.9 %  sodium chloride infusion, , Intravenous, Continuous, Oley Balm, MD, Last Rate: 20.8 mL/hr at 01/27/20 0800, Rate Verify at 01/27/20 0800 .  0.9 %  sodium chloride infusion, , Intravenous, Continuous, Oley Balm, MD, Last Rate: 20.8 mL/hr at 01/27/20 0800, Rate Verify at 01/27/20 0800 .  acetaminophen (TYLENOL) tablet 650 mg, 650 mg, Oral, Q6H PRN, Charlott Holler, MD, 650 mg at 01/26/20 2302 .  Chlorhexidine Gluconate Cloth 2 % PADS 6 each, 6 each, Topical, Daily, Icard, Bradley L, DO, 6 each at 01/26/20 1430 .  docusate sodium (COLACE) capsule 100 mg, 100 mg, Oral, BID PRN, Selmer Dominion B, NP .  heparin ADULT infusion 100 units/mL (25000 units/210mL sodium chloride 0.45%), 1,650 Units/hr, Intravenous, Continuous, Joaquim Lai, RPH, Last Rate: 16.5 mL/hr at 01/27/20 0800, 1,650 Units/hr at 01/27/20 0800 .  labetalol (NORMODYNE) injection 5 mg, 5 mg, Intravenous, Q2H PRN, Charlott Holler, MD, 5 mg at 01/27/20 0227 .  pantoprazole (PROTONIX) EC tablet 40 mg, 40 mg, Oral, Daily, Selmer Dominion B, NP, 40 mg at 01/26/20  1307 .  polyethylene glycol (MIRALAX / GLYCOLAX) packet 17 g, 17 g, Oral, Daily PRN, Selmer Dominion B, NP .  sodium chloride flush (NS) 0.9 % injection 3 mL, 3 mL, Intravenous, Q12H, Oley Balm, MD, 3 mL at 01/26/20 2200 .  sodium chloride flush (NS) 0.9 % injection 3 mL, 3 mL, Intravenous, PRN, Oley Balm, MD  Labs: CBC Recent Labs    01/27/20 0200 01/27/20 0435  WBC 10.4 9.9  HGB 12.5 12.7  HCT 39.0 39.4  PLT 187 206   BMET Recent Labs    01/25/20 1647 01/26/20 0152  NA 138 139  K 3.4* 3.6  CL 102 105  CO2 25 21*  GLUCOSE 128* 116*  BUN 13 8  CREATININE 1.23* 0.97  CALCIUM 9.0 9.4   LFT Recent Labs    01/25/20 1647  PROT 7.7  ALBUMIN 3.7  AST 24  ALT 21  ALKPHOS 65  BILITOT 0.5   PT/INR No results for input(s): LABPROT, INR in the last 72 hours.   Studies/Results: CT Angio Chest PE W and/or Wo Contrast  Result Date: 01/25/2020 CLINICAL DATA:  Sudden onset chest pain. Tachycardia. Patient improved for Achilles tendon tear EXAM: CT ANGIOGRAPHY CHEST WITH CONTRAST TECHNIQUE: Multidetector CT imaging of the chest was performed using the standard protocol during bolus administration of intravenous contrast. Multiplanar CT image reconstructions and MIPs were obtained to evaluate the vascular anatomy. CONTRAST:  OMNIPAQUE IOHEXOL 350  MG/ML SOLN COMPARISON:  Is 6 FINDINGS: Cardiovascular: There is bilateral proximal occlusive pulmonary thromboemboli within the LEFT and RIGHT proximal segmental branches of the upper lobes and lower lobes. Overall clot burden is severe with occlusive thrombus within the RIGHT lower lobe and partially occlusive thrombus in the LEFT lower lobe. Partially occlusive thrombus in the upper lobes. The RIGHT ventricular to LEFT ventricular ratio is > 1 (4.2 / 2.8 equals 1.5) indicating RIGHT ventricular strain. Mediastinum/Nodes: No axillary or supraclavicular adenopathy. No mediastinal or hilar adenopathy. No pericardial fluid.  Esophagus normal. Lungs/Pleura: No pulmonary infarction. No pleural fluid. Airways normal. Upper Abdomen: Limited view of the liver, kidneys, pancreas are unremarkable. Normal adrenal glands. Musculoskeletal: No aggressive osseous lesion. Review of the MIP images confirms the above findings. IMPRESSION: 1. Bilateral proximal occlusive pulmonary thromboemboli. Overall clot burden is severe. 2. Evidence of RIGHT ventricular strain with RV to LV ratio equal 1.5. 3. No pulmonary infarction. Critical Value/emergent results were called by telephone at the time of interpretation on 01/25/2020 at 6:29 pm to provider [facility] , who verbally acknowledged these results. Recommend activating submassive PE/RIGHT ventricular strain protocol in EPIC. Electronically Signed   By: Genevive Bi M.D.   On: 01/25/2020 18:31   IR Angiogram Pulmonary Bilateral Selective  Result Date: 01/26/2020 INDICATION: Sub massive symptomatic pulmonary emboli with right heart strain EXAM: 1. ULTRASOUND GUIDANCE FOR VENOUS ACCESS X2 2. FLUOROSCOPIC GUIDED PLACEMENT OF BILATERAL PULMONARY ARTERIAL LYTIC INFUSION CATHETERS COMPARISON:  Chest CTA-01/25/2020 MEDICATIONS: Intravenous Fentanyl and Versed 1mg  were administered as conscious sedation during continuous monitoring of the patient's level of consciousness and physiological / cardiorespiratory status by the radiology RN, with a total moderate sedation time of 34 minutes. CONTRAST:  None required FLUOROSCOPY TIME:  5 minutes 6 seconds; 28 mGy COMPLICATIONS: None immediate TECHNIQUE: Informed written consent was obtained from the patient after a discussion of the risks, benefits and alternatives to treatment. Questions regarding the procedure were encouraged and answered. A timeout was performed prior to the initiation of the procedure. Ultrasound scanning was performed of the right IJ and demonstrated patency which was documented. As such, the right internal jugular vein was  selected for vascular access. The right neck was prepped and draped in the usual sterile fashion, and a sterile drape was applied covering the operative field. Maximum barrier sterile technique with sterile gowns and gloves were used for the procedure. A timeout was performed prior to the initiation of the procedure. Local anesthesia was provided with 1% lidocaine. Under direct ultrasound guidance, the right internal jugular vein was accessed with a micro puncture sheath ultimately allowing placement of a 6 French vascular sheath. Slightly cranial to this initial access, the right internal jugular vein was again accessed with a micropuncture sheath ultimately allowing placement of a 6 French vascular sheath. With the use of a Bentson wire, a 6 French curved pigtail catheter was advanced into the right main pulmonary artery. Pressure measurements obtained. Over an exchange length wire, the pigtail catheter was exchanged for a 90/16 cm multi side-hole infusion catheter. In similar fashion, With the use of a Bentson wire, the 6 French curved pigtail catheter was again advanced to the left main pulmonary artery. Over an exchange length wire, the pigtail catheter was exchanged for a 90/10 cm multi side-hole infusion catheter. A postprocedural fluoroscopic image was obtained of the check demonstrating final catheter positioning. Both vascular sheath were secured at the right neck with interrupted 0 Prolene sutures. The external catheter tubing was secured  at the right chest and the lytic therapy was initiated. The patient tolerated the procedure well without immediate postprocedural complication. FINDINGS: Initial pulmonary arterial pressure measurements: Right main pulmonary artery-48/21 (32) mmHg (normal: < 25/10) Following the procedure, both infusion catheter tips terminate within the distal aspects of the bilateral lower lobe sub segmental pulmonary arteries. IMPRESSION: 1. Successful fluoroscopic guided initiation  of bilateral catheter directed pulmonary arterial lysis for sub massive pulmonary embolism and right-sided heart strain. 2. Markedly elevated pressure measurements within the right main pulmonary artery compatible with pulmonary arterial hypertension. PLAN: -infuse x 12 hours, then repeat pressure measurements and either removal of the catheters or continuation of the catheter directed thrombolysis. Electronically Signed   By: Corlis Leak M.D.   On: 01/26/2020 12:01   IR Angiogram Selective Each Additional Vessel  Result Date: 01/26/2020 INDICATION: Sub massive symptomatic pulmonary emboli with right heart strain EXAM: 1. ULTRASOUND GUIDANCE FOR VENOUS ACCESS X2 2. FLUOROSCOPIC GUIDED PLACEMENT OF BILATERAL PULMONARY ARTERIAL LYTIC INFUSION CATHETERS COMPARISON:  Chest CTA-01/25/2020 MEDICATIONS: Intravenous Fentanyl and Versed 1mg  were administered as conscious sedation during continuous monitoring of the patient's level of consciousness and physiological / cardiorespiratory status by the radiology RN, with a total moderate sedation time of 34 minutes. CONTRAST:  None required FLUOROSCOPY TIME:  5 minutes 6 seconds; 28 mGy COMPLICATIONS: None immediate TECHNIQUE: Informed written consent was obtained from the patient after a discussion of the risks, benefits and alternatives to treatment. Questions regarding the procedure were encouraged and answered. A timeout was performed prior to the initiation of the procedure. Ultrasound scanning was performed of the right IJ and demonstrated patency which was documented. As such, the right internal jugular vein was selected for vascular access. The right neck was prepped and draped in the usual sterile fashion, and a sterile drape was applied covering the operative field. Maximum barrier sterile technique with sterile gowns and gloves were used for the procedure. A timeout was performed prior to the initiation of the procedure. Local anesthesia was provided with 1%  lidocaine. Under direct ultrasound guidance, the right internal jugular vein was accessed with a micro puncture sheath ultimately allowing placement of a 6 French vascular sheath. Slightly cranial to this initial access, the right internal jugular vein was again accessed with a micropuncture sheath ultimately allowing placement of a 6 French vascular sheath. With the use of a Bentson wire, a 6 French curved pigtail catheter was advanced into the right main pulmonary artery. Pressure measurements obtained. Over an exchange length wire, the pigtail catheter was exchanged for a 90/16 cm multi side-hole infusion catheter. In similar fashion, With the use of a Bentson wire, the 6 French curved pigtail catheter was again advanced to the left main pulmonary artery. Over an exchange length wire, the pigtail catheter was exchanged for a 90/10 cm multi side-hole infusion catheter. A postprocedural fluoroscopic image was obtained of the check demonstrating final catheter positioning. Both vascular sheath were secured at the right neck with interrupted 0 Prolene sutures. The external catheter tubing was secured at the right chest and the lytic therapy was initiated. The patient tolerated the procedure well without immediate postprocedural complication. FINDINGS: Initial pulmonary arterial pressure measurements: Right main pulmonary artery-48/21 (32) mmHg (normal: < 25/10) Following the procedure, both infusion catheter tips terminate within the distal aspects of the bilateral lower lobe sub segmental pulmonary arteries. IMPRESSION: 1. Successful fluoroscopic guided initiation of bilateral catheter directed pulmonary arterial lysis for sub massive pulmonary embolism and right-sided heart strain. 2.  Markedly elevated pressure measurements within the right main pulmonary artery compatible with pulmonary arterial hypertension. PLAN: -infuse x 12 hours, then repeat pressure measurements and either removal of the catheters or  continuation of the catheter directed thrombolysis. Electronically Signed   By: Corlis Leak M.D.   On: 01/26/2020 12:01   IR US Guide Vasc Access Right  Result Date: 01/26/2020 INDICATION: Sub massive symptomatic pulmonary emboli with right heart strain EXAM: 1. ULTRASOUND GUIDANCE FOR VENOUS ACCESS X2 2. FLUOROSCOPIC GUIDED PLACEMENT OF BILATERAL PULMONARY ARTERIAL LYTIC INFUSION CATHETERS COMPARISON:  Chest CTA-01/25/2020 MEDICATIONS: Intravenous Fentanyl and Versed  were administered as conscious sedation during continuous monitoring of the patient's level of consciousness and physiological / cardiorespiratory status by the radiology RN, with a total moderate sedation time of 34 minutes. CONTRAST:  None required FLUOROSCOPY TIME:  5 minutes 6 seconds; 28 mGy COMPLICATIONS: None immediate TECHNIQUE: Informed written consent was obtained from the patient after a discussion of the risks, benefits and alternatives to treatment. Questions regarding the procedure were encouraged and answered. A timeout was performed prior to the initiation of the procedure. Ultrasound scanning was performed of the right IJ and demonstrated patency which was documented. As such, the right internal jugular vein was selected for vascular access. The right neck was prepped and draped in the usual sterile fashion, and a sterile drape was applied covering the operative field. Maximum barrier sterile technique with sterile gowns and gloves were used for the procedure. A timeout was performed prior to the initiation of the procedure. Local anesthesia was provided with 1% lidocaine. Under direct ultrasound guidance, the right internal jugular vein was accessed with a micro puncture sheath ultimately allowing placement of a 6 French vascular sheath. Slightly cranial to this initial access, the right internal jugular vein was again accessed with a micropuncture sheath ultimately allowing placement of a 6 French vascular sheath. With  the use of a Bentson wire, a 6 French curved pigtail catheter was advanced into the right main pulmonary artery. Pressure measurements obtained. Over an exchange length wire, the pigtail catheter was exchanged for a 90/16 cm multi side-hole infusion catheter. In similar fashion, With the use of a Bentson wire, the 6 French curved pigtail catheter was again advanced to the left main pulmonary artery. Over an exchange length wire, the pigtail catheter was exchanged for a 90/10 cm multi side-hole infusion catheter. A postprocedural fluoroscopic image was obtained of the check demonstrating final catheter positioning. Both vascular sheath were secured at the right neck with interrupted 0 Prolene sutures. The external catheter tubing was secured at the right chest and the lytic therapy was initiated. The patient tolerated the procedure well without immediate postprocedural complication. FINDINGS: Initial pulmonary arterial pressure measurements: Right main pulmonary artery-48/21 (32) mmHg (normal: < 25/10) Following the procedure, both infusion catheter tips terminate within the distal aspects of the bilateral lower lobe sub segmental pulmonary arteries. IMPRESSION: 1. Successful fluoroscopic guided initiation of bilateral catheter directed pulmonary arterial lysis for sub massive pulmonary embolism and right-sided heart strain. 2. Markedly elevated pressure measurements within the right main pulmonary artery compatible with pulmonary arterial hypertension. PLAN: -infuse x 12 hours, then repeat pressure measurements and either removal of the catheters or continuation of the catheter directed thrombolysis. Electronically Signed   By: Corlis Leak M.D.   On: 01/26/2020 12:01   DG Chest Port 1 View  Result Date: 01/25/2020 CLINICAL DATA:  Acute chest pain and shortness of breath today. EXAM: PORTABLE CHEST 1 VIEW COMPARISON:  02/22/2015 FINDINGS: The cardiomediastinal silhouette is unremarkable. There is no evidence of  focal airspace disease, pulmonary edema, suspicious pulmonary nodule/mass, pleural effusion, or pneumothorax. No acute bony abnormalities are identified. IMPRESSION: No active disease. Electronically Signed   By: Harmon Pier M.D.   On: 01/25/2020 17:10   ECHOCARDIOGRAM COMPLETE  Result Date: 01/26/2020    ECHOCARDIOGRAM REPORT   Patient Name:   [name] Date of Exam: 01/26/2020 Medical Rec #:  [id number]        Height:       70.0 in Accession #:    [id number]       Weight:       316.6 lb Date of Birth:  [birth year]       BSA:          2.537 m Patient Age:    43 years         BP:           132/78 mmHg Patient Gender: F                HR:           117 bpm. Exam Location:  Inpatient Procedure: 2D Echo, Color Doppler, Cardiac Doppler and Intracardiac            Opacification Agent                              MODIFIED REPORT: This report was modified by Chilton Si MD on 01/26/2020 due to LVEF.  Indications:     I26.02 Pulmonary embolus  History:         Patient has no prior history of Echocardiogram examinations.                  Risk Factors:Dyslipidemia.  Sonographer:     Irving Burton Senior RDCS Referring Phys:  [id number] Wayne Both SIMPSON Diagnosing Phys: Chilton Si MD IMPRESSIONS  1. Left ventricular ejection fraction, by estimation, is 60 to 65%. The left ventricle has normal function. The left ventricle has no regional wall motion abnormalities. There is mild concentric left ventricular hypertrophy. Left ventricular diastolic parameters are indeterminate.  2. Right ventricular systolic function is mildly reduced. The right ventricular size is mildly enlarged. There is mildly elevated pulmonary artery systolic pressure.  3. The mitral valve is normal in structure. No evidence of mitral valve regurgitation. No evidence of mitral stenosis.  4. The aortic valve is tricuspid. Aortic valve regurgitation is not visualized. No aortic stenosis is present.  5. The inferior vena cava is dilated in size with >50%  respiratory variability, suggesting right atrial pressure of 8 mmHg. FINDINGS  Left Ventricle: Left ventricular ejection fraction, by estimation, is 60 to 65%. The left ventricle has normal function. The left ventricle has no regional wall motion abnormalities. Definity contrast agent was given IV to delineate the left ventricular  endocardial borders. The left ventricular internal cavity size was normal in size. There is mild concentric left ventricular hypertrophy. Left ventricular diastolic parameters are indeterminate. Right Ventricle: The right ventricular size is mildly enlarged. No increase in right ventricular wall thickness. Right ventricular systolic function is mildly reduced. There is mildly elevated pulmonary artery systolic pressure. The tricuspid regurgitant  velocity is 2.74 m/s, and with an assumed right atrial pressure of 8 mmHg, the estimated right ventricular systolic pressure is 38.0 mmHg. Left Atrium: Left atrial size was normal in size. Right Atrium: Right atrial size was  normal in size. Pericardium: Trivial pericardial effusion is present. Mitral Valve: The mitral valve is normal in structure. No evidence of mitral valve regurgitation. No evidence of mitral valve stenosis. Tricuspid Valve: The tricuspid valve is normal in structure. Tricuspid valve regurgitation is mild . No evidence of tricuspid stenosis. Aortic Valve: The aortic valve is tricuspid. Aortic valve regurgitation is not visualized. No aortic stenosis is present. Pulmonic Valve: The pulmonic valve was normal in structure. Pulmonic valve regurgitation is not visualized. No evidence of pulmonic stenosis. Aorta: The aortic root is normal in size and structure. Venous: The inferior vena cava is dilated in size with greater than 50% respiratory variability, suggesting right atrial pressure of 8 mmHg. IAS/Shunts: No atrial level shunt detected by color flow Doppler.  LEFT VENTRICLE PLAX 2D LVIDd:         3.70 cm LVIDs:         2.30 cm  LV PW:         1.10 cm LV IVS:        1.10 cm LVOT diam:     1.80 cm LV SV:         27 LV SV Index:   11 LVOT Area:     2.54 cm  RIGHT VENTRICLE RV S prime:     7.94 cm/s TAPSE (M-mode): 1.2 cm LEFT ATRIUM             Index       RIGHT ATRIUM           Index LA diam:        2.40 cm 0.95 cm/m  RA Area:     18.90 cm LA Vol (A2C):   34.6 ml 13.64 ml/m RA Volume:   52.20 ml  20.57 ml/m LA Vol (A4C):   24.0 ml 9.46 ml/m LA Biplane Vol: 29.2 ml 11.51 ml/m  AORTIC VALVE LVOT Vmax:   71.80 cm/s LVOT Vmean:  53.400 cm/s LVOT VTI:    0.105 m  AORTA Ao Root diam: 2.70 cm Ao Asc diam:  3.20 cm TRICUSPID VALVE TR Peak grad:   30.0 mmHg TR Vmax:        274.00 cm/s  SHUNTS Systemic VTI:  0.10 m Systemic Diam: 1.80 cm Chilton Siiffany Brimson MD Electronically signed by Chilton Siiffany [facility] MD Signature Date/Time: 01/26/2020/1:42:23 PM    Final (Updated)    IR INFUSION THROMBOL ARTERIAL INITIAL (MS)  Result Date: 01/26/2020 INDICATION: Sub massive symptomatic pulmonary emboli with right heart strain EXAM: 1. ULTRASOUND GUIDANCE FOR VENOUS ACCESS X2 2. FLUOROSCOPIC GUIDED PLACEMENT OF BILATERAL PULMONARY ARTERIAL LYTIC INFUSION CATHETERS COMPARISON:  Chest CTA-01/25/2020 MEDICATIONS: Intravenous Fentanyl 50mcg and Versed 1mg  were administered as conscious sedation during continuous monitoring of the patient's level of consciousness and physiological / cardiorespiratory status by the radiology RN, with a total moderate sedation time of 34 minutes. CONTRAST:  None required FLUOROSCOPY TIME:  5 minutes 6 seconds; 28 mGy COMPLICATIONS: None immediate TECHNIQUE: Informed written consent was obtained from the patient after a discussion of the risks, benefits and alternatives to treatment. Questions regarding the procedure were encouraged and answered. A timeout was performed prior to the initiation of the procedure. Ultrasound scanning was performed of the right IJ and demonstrated patency which was documented. As such, the right internal  jugular vein was selected for vascular access. The right neck was prepped and draped in the usual sterile fashion, and a sterile drape was applied covering the operative field. Maximum barrier sterile technique with sterile gowns and  gloves were used for the procedure. A timeout was performed prior to the initiation of the procedure. Local anesthesia was provided with 1% lidocaine. Under direct ultrasound guidance, the right internal jugular vein was accessed with a micro puncture sheath ultimately allowing placement of a 6 French vascular sheath. Slightly cranial to this initial access, the right internal jugular vein was again accessed with a micropuncture sheath ultimately allowing placement of a 6 French vascular sheath. With the use of a Bentson wire, a 6 French curved pigtail catheter was advanced into the right main pulmonary artery. Pressure measurements obtained. Over an exchange length wire, the pigtail catheter was exchanged for a 90/16 cm multi side-hole infusion catheter. In similar fashion, With the use of a Bentson wire, the 6 French curved pigtail catheter was again advanced to the left main pulmonary artery. Over an exchange length wire, the pigtail catheter was exchanged for a 90/10 cm multi side-hole infusion catheter. A postprocedural fluoroscopic image was obtained of the check demonstrating final catheter positioning. Both vascular sheath were secured at the right neck with interrupted 0 Prolene sutures. The external catheter tubing was secured at the right chest and the lytic therapy was initiated. The patient tolerated the procedure well without immediate postprocedural complication. FINDINGS: Initial pulmonary arterial pressure measurements: Right main pulmonary artery-48/21 (32) mmHg (normal: < 25/10) Following the procedure, both infusion catheter tips terminate within the distal aspects of the bilateral lower lobe sub segmental pulmonary arteries. IMPRESSION: 1. Successful fluoroscopic  guided initiation of bilateral catheter directed pulmonary arterial lysis for sub massive pulmonary embolism and right-sided heart strain. 2. Markedly elevated pressure measurements within the right main pulmonary artery compatible with pulmonary arterial hypertension. PLAN: -infuse x 12 hours, then repeat pressure measurements and either removal of the catheters or continuation of the catheter directed thrombolysis. Electronically Signed   By: Corlis Leak M.D.   On: 01/26/2020 12:01   IR INFUSION THROMBOL ARTERIAL INITIAL (MS)  Result Date: 01/26/2020 INDICATION: Sub massive symptomatic pulmonary emboli with right heart strain EXAM: 1. ULTRASOUND GUIDANCE FOR VENOUS ACCESS X2 2. FLUOROSCOPIC GUIDED PLACEMENT OF BILATERAL PULMONARY ARTERIAL LYTIC INFUSION CATHETERS COMPARISON:  Chest CTA-01/25/2020 MEDICATIONS: Intravenous Fentanyl and Versed 1mg  were administered as conscious sedation during continuous monitoring of the patient's level of consciousness and physiological / cardiorespiratory status by the radiology RN, with a total moderate sedation time of 34 minutes. CONTRAST:  None required FLUOROSCOPY TIME:  5 minutes 6 seconds; 28 mGy COMPLICATIONS: None immediate TECHNIQUE: Informed written consent was obtained from the patient after a discussion of the risks, benefits and alternatives to treatment. Questions regarding the procedure were encouraged and answered. A timeout was performed prior to the initiation of the procedure. Ultrasound scanning was performed of the right IJ and demonstrated patency which was documented. As such, the right internal jugular vein was selected for vascular access. The right neck was prepped and draped in the usual sterile fashion, and a sterile drape was applied covering the operative field. Maximum barrier sterile technique with sterile gowns and gloves were used for the procedure. A timeout was performed prior to the initiation of the procedure. Local anesthesia was  provided with 1% lidocaine. Under direct ultrasound guidance, the right internal jugular vein was accessed with a micro puncture sheath ultimately allowing placement of a 6 French vascular sheath. Slightly cranial to this initial access, the right internal jugular vein was again accessed with a micropuncture sheath ultimately allowing placement of a 6 French vascular sheath. With the use  of a Bentson wire, a 6 French curved pigtail catheter was advanced into the right main pulmonary artery. Pressure measurements obtained. Over an exchange length wire, the pigtail catheter was exchanged for a 90/16 cm multi side-hole infusion catheter. In similar fashion, With the use of a Bentson wire, the 6 French curved pigtail catheter was again advanced to the left main pulmonary artery. Over an exchange length wire, the pigtail catheter was exchanged for a 90/10 cm multi side-hole infusion catheter. A postprocedural fluoroscopic image was obtained of the check demonstrating final catheter positioning. Both vascular sheath were secured at the right neck with interrupted 0 Prolene sutures. The external catheter tubing was secured at the right chest and the lytic therapy was initiated. The patient tolerated the procedure well without immediate postprocedural complication. FINDINGS: Initial pulmonary arterial pressure measurements: Right main pulmonary artery-48/21 (32) mmHg (normal: < 25/10) Following the procedure, both infusion catheter tips terminate within the distal aspects of the bilateral lower lobe sub segmental pulmonary arteries. IMPRESSION: 1. Successful fluoroscopic guided initiation of bilateral catheter directed pulmonary arterial lysis for sub massive pulmonary embolism and right-sided heart strain. 2. Markedly elevated pressure measurements within the right main pulmonary artery compatible with pulmonary arterial hypertension. PLAN: -infuse x 12 hours, then repeat pressure measurements and either removal of the  catheters or continuation of the catheter directed thrombolysis. Electronically Signed   By: Corlis Leak M.D.   On: 01/26/2020 12:01    Assessment/Plan: 44 yo with submassive bilateral pulmonary emboli and right heart strain.  Patient underwent catheter-directed thrombolysis of the bilateral PE on 10/24.  TPA infusion completed without complication. No evidence of unexpected bleeding.  RT PA catheter pulled back approximately 10 cm and PA pressure obtained, 46/25 (mean 35). PA pressure did not change following thrombolysis.  Vascular sheaths removed at bedside.  IR is available if needed.      LOS: 2 days    Arn Medal PA-C 01/27/2020 9:04 AM

## 2020-01-27 NOTE — Discharge Instructions (Signed)

## 2020-01-27 NOTE — Progress Notes (Addendum)
NAME:  [name], MRN:  [id number], DOB:  [birth year], LOS: 2 ADMISSION DATE:  01/25/2020, CONSULTATION DATE:  01/25/2020 REFERRING MD: Lyndel Safe, PA, CHIEF COMPLAINT:  SOB/ CP  Brief History   44 year old female who presented with acute onset of chest pain and shortness of breath with recent 11 week hx of left lower extremity immobilization secondary to Achilles tear found to be tachycardic, tachypneic, and hypoxic 90% on RA with CTA PE showing multiple proximal occlusive PE's with LV/ RV ratio 1.5.  Blood pressure normotensive.  Started on heparin gtt and transferred to [facility] for further care and evaluation.   History of present illness    44 year old female with prior history of HLD (not on meds, working on diet/exercise), morbid obesity, and GERD who presented to [facility] ER 10/23 who was in her normal state of health who had acute onset of substernal non-radiating chest tightness and shortness of breath while walking to her car today this morning.  Due to achilles tear, she has been in a cam walker for 11 weeks.  She additionally works from home and has prolonged sitting episodes.  Several days ago, she noticed some swelling in her left calf but denies any pain or tenderness; is in transition out of her cam walker and told by her PT that intermittent swelling was normal.  No previous history of DVT/ PE, no recent trips, she is s/p hysterectomy, and not taking any anticoagulants or antiplatelets.  No prior personal history or family history of clotting disorders.  Denies any dizziness or near syncopal episodes.   In ER, she was tachycardic in the 140's, tachypneic, blood pressure 144/80, but hypoxic at 90% on room air, improved on 2L [place].  Workup noted for sCr 1.23,  troponin hs 64-> 749, BNP 37, CXR no acute process, COVID neg, EKG showed sinus tachycardia and borderline prolonged QTc.  CTA PE showed multiple proximal occlusive PE's with LV/ RV ratio 1.5.  She was started on heparin gtt  and to be transferred to [facility], accepted by [facility] for close ICU observation, evaluation, and care.    Past Medical History  HLD, morbid obesity, GERD  Significant Hospital Events   10/23 [facility] ER -> [facility]  Consults:   Procedures:   Significant Diagnostic Tests:  01/25/2020 CTA PE >> 1. Bilateral proximal occlusive pulmonary thromboemboli. Overall clot burden is severe. 2. Evidence of RIGHT ventricular strain with RV to LV ratio equal 1.5. 3. No pulmonary infarction.  Micro Data:  10/23 SARS 2/ flu >> neg  Antimicrobials:  n/a  Interim history/subjective:  No events, breathing improved. Has ongoing tpa through PA catheters.  Objective   Blood pressure (!) 150/86, pulse (!) 111, temperature 98.6 F (37 C), temperature source Oral, resp. rate (!) 29, height 5\' 10"  (1.778 m), weight (!) 145.6 kg, SpO2 98 %.        Intake/Output Summary (Last 24 hours) at 01/27/2020 0946 Last data filed at 01/27/2020 0800 Gross per 24 hour  Intake 1498.93 ml  Output 1120 ml  Net 378.93 ml   Filed Weights   01/25/20 1639 01/26/20 0600 01/27/20 0500  Weight: (!) 145.2 kg (!) 143.6 kg (!) 145.6 kg   Examination: Constitutional: middle age woman in no acute distress Eyes: eyes are anicteric, reactive to light Ears, nose, mouth, and throat: mucous membranes moist, trachea midline Cardiovascular: heart sounds are regular, tachycardic, ext are warm to touch. no edema Respiratory: clear, no accessory muscle use Gastrointestinal: abdomen is  soft with + BS Skin: No rashes, normal turgor Neurologic: moves all 4 ext to command, good strength Psychiatric: good insight, AO x3  Labs look good including heparin level  Resolved Hospital Problem list    Assessment & Plan:  Provoked submassive PE s/p cdTPA-  Improved symptomatically, not much change by PA numbers but they are not that high to begin with AKI- improved - Catheters out this AM.  Appcreciate Dr. Manon Hilding help - Switch to NoAC this  evening, will see what is covered - 6 month duration for 1st time provoked submassive PE - Walk around unit this evening to see how she does - If does well tomorrow and able to stay off O2, potentially DC home  Myrla Halsted MD PCCM

## 2020-01-27 NOTE — Progress Notes (Signed)
ANTICOAGULATION CONSULT NOTE  Pharmacy Consult for Heparin Indication: pulmonary embolus with catheter-directed thrombolysis  No Known Allergies  Patient Measurements: Height: 5\' 10"  (177.8 cm) Weight: (!) 143.6 kg (316 lb 9.3 oz) IBW/kg (Calculated) : 68.5 Heparin Dosing Weight: 103.5 kg   Vital Signs: Temp: 98.2 F (36.8 C) (10/25 0007) Temp Source: Oral (10/25 0007) BP: 152/77 (10/25 0230) Pulse Rate: 98 (10/25 0230)  Labs: Recent Labs    01/25/20 1647 01/25/20 1647 01/25/20 1902 01/26/20 0152 01/26/20 0152 01/26/20 1400 01/26/20 1400 01/26/20 1941 01/26/20 2210 01/27/20 0200  HGB 13.3   < >  --  13.2   < > 12.9   < >  --  12.5 12.5  HCT 41.6   < >  --  41.2   < > 40.6  --   --  38.8 39.0  PLT 267   < >  --  271   < > 247  --   --  207 187  HEPARINUNFRC  --   --   --  0.43   < > 0.64  --  0.61  --  0.66  CREATININE 1.23*  --   --  0.97  --   --   --   --   --   --   TROPONINIHS 64*  --  749* 1,136*  --   --   --   --   --   --    < > = values in this interval not displayed.    Estimated Creatinine Clearance: 113.9 mL/min (by C-G formula based on SCr of 0.97 mg/dL).  Assessment: 44 y.o. female with large submassive central bilateral pulmonary emboli on IV Heparin.  Patient went to IR this AM to start catheter-directed thrombolysis. Patient is on Alteplase 12mg  per catheter x2 catheters infusing at 1mg /hr for 12 hours. Heparin drip is currently running at 1650 units/hr.   Heparin level is 0.64 - up from last level prior to lysis of 0.43. Fibrinogen is 510. H/H is stable. Platelets are within normal limits and stable. Per RN, no overt bleeding noted - catheter site looks good.   10/25 AM update:  Heparin level continues to be therapeutic  Alteplase now off Hgb stable  Goal of Therapy:  Heparin level 0.3-0.7 units/ml Monitor platelets by anticoagulation protocol: Yes   Plan:  Cont heparin at 1650 units/hr Daily CBC/HL Monitor for bleeding  ,  PharmD, BCPS Clinical Pharmacist Phone: [phone]

## 2020-01-27 NOTE — TOC Benefit Eligibility Note (Signed)
Transition of Care [facility]) Benefit Eligibility Note    Patient Details  Name: [name] MRN: [id number] Date of Birth: Jan 07, 1976   Medication/Dose: Everlene Balls  5 MG BID  Covered?: Yes  Tier: 2 Drug  Prescription Coverage Preferred Pharmacy: [facility]  Spoke with Person/Company/Phone Number:: KATE   @  [employer] #  [phone]  Co-Pay: $90.00  Prior Approval: No  Deductible: Unmet  Additional Notes: APIXABAN : Earle Gell Phone Number: 01/27/2020, 1:58 PM

## 2020-01-27 NOTE — Progress Notes (Addendum)
ANTICOAGULATION CONSULT NOTE  Pharmacy Consult for Heparin => Apixaban Indication: pulmonary embolus with catheter-directed thrombolysis  No Known Allergies  Patient Measurements: Height: 5\' 10"  (177.8 cm) Weight: (!) 145.6 kg (320 lb 15.8 oz) IBW/kg (Calculated) : 68.5 Heparin Dosing Weight: 103.5 kg   Vital Signs: Temp: 98.6 F (37 C) (10/25 0817) Temp Source: Oral (10/25 0817) BP: 150/86 (10/25 0900) Pulse Rate: 111 (10/25 0900)  Labs: Recent Labs    01/25/20 1647 01/25/20 1647 01/25/20 1902 01/26/20 0152 01/26/20 1400 01/26/20 1941 01/26/20 2210 01/26/20 2210 01/27/20 0200 01/27/20 0435  HGB 13.3   < >  --  13.2   < >  --  12.5   < > 12.5 12.7  HCT 41.6   < >  --  41.2   < >  --  38.8  --  39.0 39.4  PLT 267   < >  --  271   < >  --  207  --  187 206  HEPARINUNFRC  --   --   --  0.43   < > 0.61  --   --  0.66 0.69  CREATININE 1.23*  --   --  0.97  --   --   --   --   --   --   TROPONINIHS 64*  --  749* 1,136*  --   --   --   --   --   --    < > = values in this interval not displayed.    Estimated Creatinine Clearance: 114.8 mL/min (by C-G formula based on SCr of 0.97 mg/dL).  Assessment: 44 y.o. female with large submassive central bilateral pulmonary emboli on IV Heparin.  Patient went to IR this AM to start catheter-directed thrombolysis. Patient is on Alteplase 12mg  per catheter x2 catheters infusing at 1mg /hr for 12 hours. Heparin drip is currently running at 1650 units/hr.   Heparin level is 0.69 - up from last level prior to lysis of 0.43. Fibrinogen is 510. H/H is stable. Platelets are within normal limits and stable. No overt bleeding noted - catheter site looks good.   Goal of Therapy:  Heparin level 0.3-0.7 units/ml Monitor platelets by anticoagulation protocol: Yes   Plan:  Decrease heparin to 1600 units/hr Daily CBC/HL Monitor for bleeding  ADDENDUM:  Start Apixaban (without load) 5 mg po bid at 1200 Stop Heparin drip at 1200 Monitor for  bleeding  54, PharmD, [facility] Clinical Pharmacist Please see AMION for all Pharmacists' Contact Phone Numbers 01/27/2020, 9:19 AM

## 2020-01-27 NOTE — TOC Benefit Eligibility Note (Signed)
Transition of Care [facility]) Benefit Eligibility Note    Patient Details  Name: [name] MRN: [id number] Date of Birth: Mar 09, 1976   Medication/Dose: Carlena Hurl 10 MG BID  Covered?: Yes  Tier: 2 Drug  Prescription Coverage [facility]  Spoke with Person/Company/Phone Number:: MARVIN @ [employer]  # [phone]  Co-Pay: 20 % OF TOTAL COST  Prior Approval: No  Deductible: Unmet  Additional Notes: XARELTON  5 MG BID - NON-FORMULARY    Mardene Sayer Phone Number: 01/27/2020, 4:24 PM

## 2020-01-28 ENCOUNTER — Encounter (HOSPITAL_COMMUNITY): Payer: Self-pay

## 2020-01-28 ENCOUNTER — Other Ambulatory Visit (HOSPITAL_COMMUNITY): Payer: Self-pay | Admitting: Internal Medicine

## 2020-01-28 DIAGNOSIS — I2601 Septic pulmonary embolism with acute cor pulmonale: Secondary | ICD-10-CM | POA: Diagnosis not present

## 2020-01-28 LAB — CBC
HCT: 36.6 % (ref 36.0–46.0)
Hemoglobin: 11.7 g/dL — ABNORMAL LOW (ref 12.0–15.0)
MCH: 26.2 pg (ref 26.0–34.0)
MCHC: 32 g/dL (ref 30.0–36.0)
MCV: 81.9 fL (ref 80.0–100.0)
Platelets: 185 10*3/uL (ref 150–400)
RBC: 4.47 MIL/uL (ref 3.87–5.11)
RDW: 13.2 % (ref 11.5–15.5)
WBC: 7.8 10*3/uL (ref 4.0–10.5)
nRBC: 0 % (ref 0.0–0.2)

## 2020-01-28 LAB — HEPARIN LEVEL (UNFRACTIONATED): Heparin Unfractionated: 1.62 IU/mL — ABNORMAL HIGH (ref 0.30–0.70)

## 2020-01-28 MED ORDER — APIXABAN 5 MG PO TABS
5.0000 mg | ORAL_TABLET | Freq: Two times a day (BID) | ORAL | 5 refills | Status: DC
Start: 2020-01-28 — End: 2020-01-28

## 2020-01-28 MED FILL — ELIQUIS 5 MG TABLET: 5 | 30 days supply | Qty: 60 | Fill #0

## 2020-01-28 NOTE — Progress Notes (Signed)
NAME:  [name], MRN:  [id number], DOB:  Nov 12, 1975, LOS: 3 ADMISSION DATE:  01/25/2020, CONSULTATION DATE:  01/25/2020 REFERRING MD: Lyndel Safe, PA, CHIEF COMPLAINT:  SOB/ CP  Brief History   44 year old female who presented with acute onset of chest pain and shortness of breath with recent 11 week hx of left lower extremity immobilization secondary to Achilles tear found to be tachycardic, tachypneic, and hypoxic 90% on RA with CTA PE showing multiple proximal occlusive PE's with LV/ RV ratio 1.5.  Blood pressure normotensive.  Started on heparin gtt and transferred to [facility] for further care and evaluation.   History of present illness    44 year old female with prior history of HLD (not on meds, working on diet/exercise), morbid obesity, and GERD who presented to [facility] ER 10/23 who was in her normal state of health who had acute onset of substernal non-radiating chest tightness and shortness of breath while walking to her car today this morning.  Due to achilles tear, she has been in a cam walker for 11 weeks.  She additionally works from home and has prolonged sitting episodes.  Several days ago, she noticed some swelling in her left calf but denies any pain or tenderness; is in transition out of her cam walker and told by her PT that intermittent swelling was normal.  No previous history of DVT/ PE, no recent trips, she is s/p hysterectomy, and not taking any anticoagulants or antiplatelets.  No prior personal history or family history of clotting disorders.  Denies any dizziness or near syncopal episodes.   In ER, she was tachycardic in the 140's, tachypneic, blood pressure 144/80, but hypoxic at 90% on room air, improved on 2L [place].  Workup noted for sCr 1.23,  troponin hs 64-> 749, BNP 37, CXR no acute process, COVID neg, EKG showed sinus tachycardia and borderline prolonged QTc.  CTA PE showed multiple proximal occlusive PE's with LV/ RV ratio 1.5.  She was started on heparin gtt  and to be transferred to [facility], accepted by [facility] for close ICU observation, evaluation, and care.    Past Medical History  HLD, morbid obesity, GERD  Significant Hospital Events   10/23 [facility] ER -> [facility]  Consults:  IR  Procedures:   Significant Diagnostic Tests:  01/25/2020 CTA PE >> 1. Bilateral proximal occlusive pulmonary thromboemboli. Overall clot burden is severe. 2. Evidence of RIGHT ventricular strain with RV to LV ratio equal 1.5. 3. No pulmonary infarction.  Micro Data:  10/23 SARS 2/ flu >> neg  Antimicrobials:  n/a  Interim history/subjective:   No overnight events. [name] states she is feeling well this morning. She is looking forward to going home.   Objective   Blood pressure 127/76, pulse 96, temperature 97.9 F (36.6 C), temperature source Oral, resp. rate (!) 24, height 5\' 10"  (1.778 m), weight (!) 144.9 kg, SpO2 97 %.        Intake/Output Summary (Last 24 hours) at 01/28/2020 0825 Last data filed at 01/28/2020 0600 Gross per 24 hour  Intake 1178.73 ml  Output 900 ml  Net 278.73 ml   Filed Weights   01/26/20 0600 01/27/20 0500 01/28/20 0328  Weight: (!) 143.6 kg (!) 145.6 kg (!) 144.9 kg   Examination: Constitutional: middle age woman in no acute distress Eyes: eyes are anicteric, reactive to light Ears, nose, mouth, and throat: mucous membranes moist, trachea midline Cardiovascular: regular rate and rhythm, ext are warm to touch. no edema  Respiratory: clear, no accessory muscle use Gastrointestinal: abdomen is soft with + BS Skin: No rashes, normal turgor Neurologic: moves all 4 ext to command, good strength Psychiatric: good insight, AO x3  Resolved Hospital Problem list    Assessment & Plan:   Provoked submassive PE s/p cdTPA-   AKI - improved - Catheters out yesterday morning.  Appcreciate Dr. Manon Hilding help. No bleeding at injection site.  - Switched to Eliquis yesterday. Tolerating well. Plan for 6 month duration for 1st time  provoked submassive PE - Not requiring oxygen at rest or with exertion  - Likely discharge home this afternoon to PCP follow up.  Dr. Verdene Lennert Internal Medicine PGY-2  01/28/2020, 8:28 AM

## 2020-01-28 NOTE — Care Management (Signed)
Patient provided with both the 30 day free and the $10 copay reduction cards for Eliquis. Patient verbalized understanding of use. Very appreciative.

## 2020-01-28 NOTE — Progress Notes (Signed)
Referring Physician(s): CCM Dr Adaline Sill  Supervising Physician: Irish Lack  Patient Status:  [facility] - In-pt  Chief Complaint:  PE Thrombolysis performed in IR 10/24  Subjective:  Pt is up in bed Pleasant Smiling Doing well Breathing so well per report  Denies SOB; pain Denies N/V ; fever;chills  Allergies: Patient has no known allergies.  Medications: Prior to Admission medications   Medication Sig Start Date End Date Taking? Authorizing Provider  cyclobenzaprine (FLEXERIL) 10 MG tablet Take 10 mg by mouth 2 (two) times daily as needed. 10/31/19  Yes [provider]  meloxicam (MOBIC) 7.5 MG tablet Take 7.5 mg by mouth daily. 11/10/19  Yes [provider]  omeprazole (PRILOSEC) 20 MG capsule Take 1 capsule (20 mg total) by mouth daily. 02/22/15  Yes Barrett, Rolm Gala, PA-C  oxyCODONE-acetaminophen (PERCOCET/ROXICET) 5-325 MG tablet Take 1-2 tablets by mouth every 4 (four) hours as needed for moderate pain. 07/03/15   Riebock, Anette Riedel, NP     Vital Signs: BP 127/76   Pulse 96   Temp 97.9 F (36.6 C) (Oral)   Resp (!) 24   Ht 5\' 10"  (1.778 m)   Wt (!) 319 lb 7.1 oz (144.9 kg)   SpO2 97%   BMI 45.84 kg/m   Physical Exam Pulmonary:     Effort: Pulmonary effort is normal.     Breath sounds: Normal breath sounds. No wheezing.  Skin:    General: Skin is warm.     Comments: Right neck site is clean and and dry NT no bleeding No sign of infection      Imaging: CT Angio Chest PE W and/or Wo Contrast  Result Date: 01/25/2020 CLINICAL DATA:  Sudden onset chest pain. Tachycardia. Patient improved for Achilles tendon tear EXAM: CT ANGIOGRAPHY CHEST WITH CONTRAST TECHNIQUE: Multidetector CT imaging of the chest was performed using the standard protocol during bolus administration of intravenous contrast. Multiplanar CT image reconstructions and MIPs were obtained to evaluate the vascular anatomy. CONTRAST:  OMNIPAQUE IOHEXOL 350 MG/ML SOLN  COMPARISON:  Is 6 FINDINGS: Cardiovascular: There is bilateral proximal occlusive pulmonary thromboemboli within the LEFT and RIGHT proximal segmental branches of the upper lobes and lower lobes. Overall clot burden is severe with occlusive thrombus within the RIGHT lower lobe and partially occlusive thrombus in the LEFT lower lobe. Partially occlusive thrombus in the upper lobes. The RIGHT ventricular to LEFT ventricular ratio is > 1 (4.2 / 2.8 equals 1.5) indicating RIGHT ventricular strain. Mediastinum/Nodes: No axillary or supraclavicular adenopathy. No mediastinal or hilar adenopathy. No pericardial fluid. Esophagus normal. Lungs/Pleura: No pulmonary infarction. No pleural fluid. Airways normal. Upper Abdomen: Limited view of the liver, kidneys, pancreas are unremarkable. Normal adrenal glands. Musculoskeletal: No aggressive osseous lesion. Review of the MIP images confirms the above findings. IMPRESSION: 1. Bilateral proximal occlusive pulmonary thromboemboli. Overall clot burden is severe. 2. Evidence of RIGHT ventricular strain with RV to LV ratio equal 1.5. 3. No pulmonary infarction. Critical Value/emergent results were called by telephone at the time of interpretation on 01/25/2020 at 6:29 pm to provider [facility] , who verbally acknowledged these results. Recommend activating submassive PE/RIGHT ventricular strain protocol in EPIC. Electronically Signed   By: Genevive Bi M.D.   On: 01/25/2020 18:31   IR Angiogram Pulmonary Bilateral Selective  Result Date: 01/26/2020 INDICATION: Sub massive symptomatic pulmonary emboli with right heart strain EXAM: 1. ULTRASOUND GUIDANCE FOR VENOUS ACCESS X2 2. FLUOROSCOPIC GUIDED PLACEMENT OF BILATERAL PULMONARY ARTERIAL LYTIC INFUSION CATHETERS COMPARISON:  Chest CTA-01/25/2020 MEDICATIONS: Intravenous Fentanyl and Versed 1mg  were administered as conscious sedation during continuous monitoring of the patient's level of consciousness and  physiological / cardiorespiratory status by the radiology RN, with a total moderate sedation time of 34 minutes. CONTRAST:  None required FLUOROSCOPY TIME:  5 minutes 6 seconds; 28 mGy COMPLICATIONS: None immediate TECHNIQUE: Informed written consent was obtained from the patient after a discussion of the risks, benefits and alternatives to treatment. Questions regarding the procedure were encouraged and answered. A timeout was performed prior to the initiation of the procedure. Ultrasound scanning was performed of the right IJ and demonstrated patency which was documented. As such, the right internal jugular vein was selected for vascular access. The right neck was prepped and draped in the usual sterile fashion, and a sterile drape was applied covering the operative field. Maximum barrier sterile technique with sterile gowns and gloves were used for the procedure. A timeout was performed prior to the initiation of the procedure. Local anesthesia was provided with 1% lidocaine. Under direct ultrasound guidance, the right internal jugular vein was accessed with a micro puncture sheath ultimately allowing placement of a 6 French vascular sheath. Slightly cranial to this initial access, the right internal jugular vein was again accessed with a micropuncture sheath ultimately allowing placement of a 6 French vascular sheath. With the use of a Bentson wire, a 6 French curved pigtail catheter was advanced into the right main pulmonary artery. Pressure measurements obtained. Over an exchange length wire, the pigtail catheter was exchanged for a 90/16 cm multi side-hole infusion catheter. In similar fashion, With the use of a Bentson wire, the 6 French curved pigtail catheter was again advanced to the left main pulmonary artery. Over an exchange length wire, the pigtail catheter was exchanged for a 90/10 cm multi side-hole infusion catheter. A postprocedural fluoroscopic image was obtained of the check demonstrating final  catheter positioning. Both vascular sheath were secured at the right neck with interrupted 0 Prolene sutures. The external catheter tubing was secured at the right chest and the lytic therapy was initiated. The patient tolerated the procedure well without immediate postprocedural complication. FINDINGS: Initial pulmonary arterial pressure measurements: Right main pulmonary artery-48/21 (32) mmHg (normal: < 25/10) Following the procedure, both infusion catheter tips terminate within the distal aspects of the bilateral lower lobe sub segmental pulmonary arteries. IMPRESSION: 1. Successful fluoroscopic guided initiation of bilateral catheter directed pulmonary arterial lysis for sub massive pulmonary embolism and right-sided heart strain. 2. Markedly elevated pressure measurements within the right main pulmonary artery compatible with pulmonary arterial hypertension. PLAN: -infuse x 12 hours, then repeat pressure measurements and either removal of the catheters or continuation of the catheter directed thrombolysis. Electronically Signed   By: Corlis Leak M.D.   On: 01/26/2020 12:01   IR Angiogram Selective Each Additional Vessel  Result Date: 01/26/2020 INDICATION: Sub massive symptomatic pulmonary emboli with right heart strain EXAM: 1. ULTRASOUND GUIDANCE FOR VENOUS ACCESS X2 2. FLUOROSCOPIC GUIDED PLACEMENT OF BILATERAL PULMONARY ARTERIAL LYTIC INFUSION CATHETERS COMPARISON:  Chest CTA-01/25/2020 MEDICATIONS: Intravenous Fentanyl and Versed 1mg  were administered as conscious sedation during continuous monitoring of the patient's level of consciousness and physiological / cardiorespiratory status by the radiology RN, with a total moderate sedation time of 34 minutes. CONTRAST:  None required FLUOROSCOPY TIME:  5 minutes 6 seconds; 28 mGy COMPLICATIONS: None immediate TECHNIQUE: Informed written consent was obtained from the patient after a discussion of the risks, benefits and alternatives to treatment.  Questions  regarding the procedure were encouraged and answered. A timeout was performed prior to the initiation of the procedure. Ultrasound scanning was performed of the right IJ and demonstrated patency which was documented. As such, the right internal jugular vein was selected for vascular access. The right neck was prepped and draped in the usual sterile fashion, and a sterile drape was applied covering the operative field. Maximum barrier sterile technique with sterile gowns and gloves were used for the procedure. A timeout was performed prior to the initiation of the procedure. Local anesthesia was provided with 1% lidocaine. Under direct ultrasound guidance, the right internal jugular vein was accessed with a micro puncture sheath ultimately allowing placement of a 6 French vascular sheath. Slightly cranial to this initial access, the right internal jugular vein was again accessed with a micropuncture sheath ultimately allowing placement of a 6 French vascular sheath. With the use of a Bentson wire, a 6 French curved pigtail catheter was advanced into the right main pulmonary artery. Pressure measurements obtained. Over an exchange length wire, the pigtail catheter was exchanged for a 90/16 cm multi side-hole infusion catheter. In similar fashion, With the use of a Bentson wire, the 6 French curved pigtail catheter was again advanced to the left main pulmonary artery. Over an exchange length wire, the pigtail catheter was exchanged for a 90/10 cm multi side-hole infusion catheter. A postprocedural fluoroscopic image was obtained of the check demonstrating final catheter positioning. Both vascular sheath were secured at the right neck with interrupted 0 Prolene sutures. The external catheter tubing was secured at the right chest and the lytic therapy was initiated. The patient tolerated the procedure well without immediate postprocedural complication. FINDINGS: Initial pulmonary arterial pressure measurements:  Right main pulmonary artery-48/21 (32) mmHg (normal: < 25/10) Following the procedure, both infusion catheter tips terminate within the distal aspects of the bilateral lower lobe sub segmental pulmonary arteries. IMPRESSION: 1. Successful fluoroscopic guided initiation of bilateral catheter directed pulmonary arterial lysis for sub massive pulmonary embolism and right-sided heart strain. 2. Markedly elevated pressure measurements within the right main pulmonary artery compatible with pulmonary arterial hypertension. PLAN: -infuse x 12 hours, then repeat pressure measurements and either removal of the catheters or continuation of the catheter directed thrombolysis. Electronically Signed   By: Corlis Leak M.D.   On: 01/26/2020 12:01   IR US Guide Vasc Access Right  Result Date: 01/26/2020 INDICATION: Sub massive symptomatic pulmonary emboli with right heart strain EXAM: 1. ULTRASOUND GUIDANCE FOR VENOUS ACCESS X2 2. FLUOROSCOPIC GUIDED PLACEMENT OF BILATERAL PULMONARY ARTERIAL LYTIC INFUSION CATHETERS COMPARISON:  Chest CTA-01/25/2020 MEDICATIONS: Intravenous Fentanyl and Versed  were administered as conscious sedation during continuous monitoring of the patient's level of consciousness and physiological / cardiorespiratory status by the radiology RN, with a total moderate sedation time of 34 minutes. CONTRAST:  None required FLUOROSCOPY TIME:  5 minutes 6 seconds; 28 mGy COMPLICATIONS: None immediate TECHNIQUE: Informed written consent was obtained from the patient after a discussion of the risks, benefits and alternatives to treatment. Questions regarding the procedure were encouraged and answered. A timeout was performed prior to the initiation of the procedure. Ultrasound scanning was performed of the right IJ and demonstrated patency which was documented. As such, the right internal jugular vein was selected for vascular access. The right neck was prepped and draped in the usual sterile fashion, and  a sterile drape was applied covering the operative field. Maximum barrier sterile technique with sterile gowns and gloves were used for the  procedure. A timeout was performed prior to the initiation of the procedure. Local anesthesia was provided with 1% lidocaine. Under direct ultrasound guidance, the right internal jugular vein was accessed with a micro puncture sheath ultimately allowing placement of a 6 French vascular sheath. Slightly cranial to this initial access, the right internal jugular vein was again accessed with a micropuncture sheath ultimately allowing placement of a 6 French vascular sheath. With the use of a Bentson wire, a 6 French curved pigtail catheter was advanced into the right main pulmonary artery. Pressure measurements obtained. Over an exchange length wire, the pigtail catheter was exchanged for a 90/16 cm multi side-hole infusion catheter. In similar fashion, With the use of a Bentson wire, the 6 French curved pigtail catheter was again advanced to the left main pulmonary artery. Over an exchange length wire, the pigtail catheter was exchanged for a 90/10 cm multi side-hole infusion catheter. A postprocedural fluoroscopic image was obtained of the check demonstrating final catheter positioning. Both vascular sheath were secured at the right neck with interrupted 0 Prolene sutures. The external catheter tubing was secured at the right chest and the lytic therapy was initiated. The patient tolerated the procedure well without immediate postprocedural complication. FINDINGS: Initial pulmonary arterial pressure measurements: Right main pulmonary artery-48/21 (32) mmHg (normal: < 25/10) Following the procedure, both infusion catheter tips terminate within the distal aspects of the bilateral lower lobe sub segmental pulmonary arteries. IMPRESSION: 1. Successful fluoroscopic guided initiation of bilateral catheter directed pulmonary arterial lysis for sub massive pulmonary embolism and  right-sided heart strain. 2. Markedly elevated pressure measurements within the right main pulmonary artery compatible with pulmonary arterial hypertension. PLAN: -infuse x 12 hours, then repeat pressure measurements and either removal of the catheters or continuation of the catheter directed thrombolysis. Electronically Signed   By: Corlis Leak M.D.   On: 01/26/2020 12:01   DG Chest Port 1 View  Result Date: 01/25/2020 CLINICAL DATA:  Acute chest pain and shortness of breath today. EXAM: PORTABLE CHEST 1 VIEW COMPARISON:  02/22/2015 FINDINGS: The cardiomediastinal silhouette is unremarkable. There is no evidence of focal airspace disease, pulmonary edema, suspicious pulmonary nodule/mass, pleural effusion, or pneumothorax. No acute bony abnormalities are identified. IMPRESSION: No active disease. Electronically Signed   By: Harmon Pier M.D.   On: 01/25/2020 17:10   ECHOCARDIOGRAM COMPLETE  Result Date: 01/26/2020    ECHOCARDIOGRAM REPORT   Patient Name:   [name] Date of Exam: 01/26/2020 Medical Rec #:  [id number]        Height:       70.0 in Accession #:    [id number]       Weight:       316.6 lb Date of Birth:  November 11, 1975       BSA:          2.537 m Patient Age:    43 years         BP:           132/78 mmHg Patient Gender: F                HR:           117 bpm. Exam Location:  Inpatient Procedure: 2D Echo, Color Doppler, Cardiac Doppler and Intracardiac            Opacification Agent  MODIFIED REPORT: This report was modified by Chilton Si MD on 01/26/2020 due to LVEF.  Indications:     I26.02 Pulmonary embolus  History:         Patient has no prior history of Echocardiogram examinations.                  Risk Factors:Dyslipidemia.  Sonographer:     Irving Burton Senior RDCS Referring Phys:  [id number] Wayne Both SIMPSON Diagnosing Phys: Chilton Si MD IMPRESSIONS  1. Left ventricular ejection fraction, by estimation, is 60 to 65%. The left ventricle has normal function.  The left ventricle has no regional wall motion abnormalities. There is mild concentric left ventricular hypertrophy. Left ventricular diastolic parameters are indeterminate.  2. Right ventricular systolic function is mildly reduced. The right ventricular size is mildly enlarged. There is mildly elevated pulmonary artery systolic pressure.  3. The mitral valve is normal in structure. No evidence of mitral valve regurgitation. No evidence of mitral stenosis.  4. The aortic valve is tricuspid. Aortic valve regurgitation is not visualized. No aortic stenosis is present.  5. The inferior vena cava is dilated in size with >50% respiratory variability, suggesting right atrial pressure of 8 mmHg. FINDINGS  Left Ventricle: Left ventricular ejection fraction, by estimation, is 60 to 65%. The left ventricle has normal function. The left ventricle has no regional wall motion abnormalities. Definity contrast agent was given IV to delineate the left ventricular  endocardial borders. The left ventricular internal cavity size was normal in size. There is mild concentric left ventricular hypertrophy. Left ventricular diastolic parameters are indeterminate. Right Ventricle: The right ventricular size is mildly enlarged. No increase in right ventricular wall thickness. Right ventricular systolic function is mildly reduced. There is mildly elevated pulmonary artery systolic pressure. The tricuspid regurgitant  velocity is 2.74 m/s, and with an assumed right atrial pressure of 8 mmHg, the estimated right ventricular systolic pressure is 38.0 mmHg. Left Atrium: Left atrial size was normal in size. Right Atrium: Right atrial size was normal in size. Pericardium: Trivial pericardial effusion is present. Mitral Valve: The mitral valve is normal in structure. No evidence of mitral valve regurgitation. No evidence of mitral valve stenosis. Tricuspid Valve: The tricuspid valve is normal in structure. Tricuspid valve regurgitation is mild . No  evidence of tricuspid stenosis. Aortic Valve: The aortic valve is tricuspid. Aortic valve regurgitation is not visualized. No aortic stenosis is present. Pulmonic Valve: The pulmonic valve was normal in structure. Pulmonic valve regurgitation is not visualized. No evidence of pulmonic stenosis. Aorta: The aortic root is normal in size and structure. Venous: The inferior vena cava is dilated in size with greater than 50% respiratory variability, suggesting right atrial pressure of 8 mmHg. IAS/Shunts: No atrial level shunt detected by color flow Doppler.  LEFT VENTRICLE PLAX 2D LVIDd:         3.70 cm LVIDs:         2.30 cm LV PW:         1.10 cm LV IVS:        1.10 cm LVOT diam:     1.80 cm LV SV:         27 LV SV Index:   11 LVOT Area:     2.54 cm  RIGHT VENTRICLE RV S prime:     7.94 cm/s TAPSE (M-mode): 1.2 cm LEFT ATRIUM             Index       RIGHT ATRIUM  Index LA diam:        2.40 cm 0.95 cm/m  RA Area:     18.90 cm LA Vol (A2C):   34.6 ml 13.64 ml/m RA Volume:   52.20 ml  20.57 ml/m LA Vol (A4C):   24.0 ml 9.46 ml/m LA Biplane Vol: 29.2 ml 11.51 ml/m  AORTIC VALVE LVOT Vmax:   71.80 cm/s LVOT Vmean:  53.400 cm/s LVOT VTI:    0.105 m  AORTA Ao Root diam: 2.70 cm Ao Asc diam:  3.20 cm TRICUSPID VALVE TR Peak grad:   30.0 mmHg TR Vmax:        274.00 cm/s  SHUNTS Systemic VTI:  0.10 m Systemic Diam: 1.80 cm Chilton Si MD Electronically signed by Chilton Si MD Signature Date/Time: 01/26/2020/1:42:23 PM    Final (Updated)    VAS [place] LOWER EXTREMITY VENOUS (DVT)  Result Date: 01/27/2020  Lower Venous DVTStudy Indications: Pulmonary embolism.  Comparison Study: no prior Performing Technologist: Blanch Media RVS  Examination Guidelines: A complete evaluation includes B-mode imaging, spectral Doppler, color Doppler, and power Doppler as needed of all accessible portions of each vessel. Bilateral testing is considered an integral part of a complete examination. Limited examinations for  reoccurring indications may be performed as noted. The reflux portion of the exam is performed with the patient in reverse Trendelenburg.  +---------+---------------+---------+-----------+----------+--------------+ RIGHT    CompressibilityPhasicitySpontaneityPropertiesThrombus Aging +---------+---------------+---------+-----------+----------+--------------+ CFV      Full           Yes      Yes                                 +---------+---------------+---------+-----------+----------+--------------+ SFJ      Full                                                        +---------+---------------+---------+-----------+----------+--------------+ FV Prox  Full                                                        +---------+---------------+---------+-----------+----------+--------------+ FV Mid                  Yes      Yes                                 +---------+---------------+---------+-----------+----------+--------------+ FV Distal               Yes      Yes                                 +---------+---------------+---------+-----------+----------+--------------+ PFV      Full                                                        +---------+---------------+---------+-----------+----------+--------------+ POP  Full           Yes      Yes                                 +---------+---------------+---------+-----------+----------+--------------+ PTV      Full                                                        +---------+---------------+---------+-----------+----------+--------------+   +---------+---------------+---------+-----------+----------+--------------+ LEFT     CompressibilityPhasicitySpontaneityPropertiesThrombus Aging +---------+---------------+---------+-----------+----------+--------------+ CFV      Full           Yes      Yes                                  +---------+---------------+---------+-----------+----------+--------------+ SFJ      Full                                                        +---------+---------------+---------+-----------+----------+--------------+ FV Prox  Full                                                        +---------+---------------+---------+-----------+----------+--------------+ FV Mid                  Yes      Yes                                 +---------+---------------+---------+-----------+----------+--------------+ FV Distal               Yes      Yes                                 +---------+---------------+---------+-----------+----------+--------------+ PFV      Full                                                        +---------+---------------+---------+-----------+----------+--------------+ POP      Full           Yes      Yes                                 +---------+---------------+---------+-----------+----------+--------------+ PTV      Full                                                        +---------+---------------+---------+-----------+----------+--------------+  PERO     Full                                                        +---------+---------------+---------+-----------+----------+--------------+     Summary: BILATERAL: - No evidence of deep vein thrombosis seen in the lower extremities, bilaterally. - No evidence of superficial venous thrombosis in the lower extremities, bilaterally. -   *See table(s) above for measurements and observations. Electronically signed by Fabienne Bruns MD on 01/27/2020 at 6:21:33 PM.    Final    IR INFUSION THROMBOL ARTERIAL INITIAL (MS)  Result Date: 01/26/2020 INDICATION: Sub massive symptomatic pulmonary emboli with right heart strain EXAM: 1. ULTRASOUND GUIDANCE FOR VENOUS ACCESS X2 2. FLUOROSCOPIC GUIDED PLACEMENT OF BILATERAL PULMONARY ARTERIAL LYTIC INFUSION CATHETERS COMPARISON:  Chest  CTA-01/25/2020 MEDICATIONS: Intravenous Fentanyl and Versed 1mg  were administered as conscious sedation during continuous monitoring of the patient's level of consciousness and physiological / cardiorespiratory status by the radiology RN, with a total moderate sedation time of 34 minutes. CONTRAST:  None required FLUOROSCOPY TIME:  5 minutes 6 seconds; 28 mGy COMPLICATIONS: None immediate TECHNIQUE: Informed written consent was obtained from the patient after a discussion of the risks, benefits and alternatives to treatment. Questions regarding the procedure were encouraged and answered. A timeout was performed prior to the initiation of the procedure. Ultrasound scanning was performed of the right IJ and demonstrated patency which was documented. As such, the right internal jugular vein was selected for vascular access. The right neck was prepped and draped in the usual sterile fashion, and a sterile drape was applied covering the operative field. Maximum barrier sterile technique with sterile gowns and gloves were used for the procedure. A timeout was performed prior to the initiation of the procedure. Local anesthesia was provided with 1% lidocaine. Under direct ultrasound guidance, the right internal jugular vein was accessed with a micro puncture sheath ultimately allowing placement of a 6 French vascular sheath. Slightly cranial to this initial access, the right internal jugular vein was again accessed with a micropuncture sheath ultimately allowing placement of a 6 French vascular sheath. With the use of a Bentson wire, a 6 French curved pigtail catheter was advanced into the right main pulmonary artery. Pressure measurements obtained. Over an exchange length wire, the pigtail catheter was exchanged for a 90/16 cm multi side-hole infusion catheter. In similar fashion, With the use of a Bentson wire, the 6 French curved pigtail catheter was again advanced to the left main pulmonary artery. Over an  exchange length wire, the pigtail catheter was exchanged for a 90/10 cm multi side-hole infusion catheter. A postprocedural fluoroscopic image was obtained of the check demonstrating final catheter positioning. Both vascular sheath were secured at the right neck with interrupted 0 Prolene sutures. The external catheter tubing was secured at the right chest and the lytic therapy was initiated. The patient tolerated the procedure well without immediate postprocedural complication. FINDINGS: Initial pulmonary arterial pressure measurements: Right main pulmonary artery-48/21 (32) mmHg (normal: < 25/10) Following the procedure, both infusion catheter tips terminate within the distal aspects of the bilateral lower lobe sub segmental pulmonary arteries. IMPRESSION: 1. Successful fluoroscopic guided initiation of bilateral catheter directed pulmonary arterial lysis for sub massive pulmonary embolism and right-sided heart strain. 2. Markedly elevated pressure measurements within the right main pulmonary artery compatible  with pulmonary arterial hypertension. PLAN: -infuse x 12 hours, then repeat pressure measurements and either removal of the catheters or continuation of the catheter directed thrombolysis. Electronically Signed   By: Corlis Leak M.D.   On: 01/26/2020 12:01   IR INFUSION THROMBOL ARTERIAL INITIAL (MS)  Result Date: 01/26/2020 INDICATION: Sub massive symptomatic pulmonary emboli with right heart strain EXAM: 1. ULTRASOUND GUIDANCE FOR VENOUS ACCESS X2 2. FLUOROSCOPIC GUIDED PLACEMENT OF BILATERAL PULMONARY ARTERIAL LYTIC INFUSION CATHETERS COMPARISON:  Chest CTA-01/25/2020 MEDICATIONS: Intravenous Fentanyl and Versed 1mg  were administered as conscious sedation during continuous monitoring of the patient's level of consciousness and physiological / cardiorespiratory status by the radiology RN, with a total moderate sedation time of 34 minutes. CONTRAST:  None required FLUOROSCOPY TIME:  5 minutes 6  seconds; 28 mGy COMPLICATIONS: None immediate TECHNIQUE: Informed written consent was obtained from the patient after a discussion of the risks, benefits and alternatives to treatment. Questions regarding the procedure were encouraged and answered. A timeout was performed prior to the initiation of the procedure. Ultrasound scanning was performed of the right IJ and demonstrated patency which was documented. As such, the right internal jugular vein was selected for vascular access. The right neck was prepped and draped in the usual sterile fashion, and a sterile drape was applied covering the operative field. Maximum barrier sterile technique with sterile gowns and gloves were used for the procedure. A timeout was performed prior to the initiation of the procedure. Local anesthesia was provided with 1% lidocaine. Under direct ultrasound guidance, the right internal jugular vein was accessed with a micro puncture sheath ultimately allowing placement of a 6 French vascular sheath. Slightly cranial to this initial access, the right internal jugular vein was again accessed with a micropuncture sheath ultimately allowing placement of a 6 French vascular sheath. With the use of a Bentson wire, a 6 French curved pigtail catheter was advanced into the right main pulmonary artery. Pressure measurements obtained. Over an exchange length wire, the pigtail catheter was exchanged for a 90/16 cm multi side-hole infusion catheter. In similar fashion, With the use of a Bentson wire, the 6 French curved pigtail catheter was again advanced to the left main pulmonary artery. Over an exchange length wire, the pigtail catheter was exchanged for a 90/10 cm multi side-hole infusion catheter. A postprocedural fluoroscopic image was obtained of the check demonstrating final catheter positioning. Both vascular sheath were secured at the right neck with interrupted 0 Prolene sutures. The external catheter tubing was secured at the right chest  and the lytic therapy was initiated. The patient tolerated the procedure well without immediate postprocedural complication. FINDINGS: Initial pulmonary arterial pressure measurements: Right main pulmonary artery-48/21 (32) mmHg (normal: < 25/10) Following the procedure, both infusion catheter tips terminate within the distal aspects of the bilateral lower lobe sub segmental pulmonary arteries. IMPRESSION: 1. Successful fluoroscopic guided initiation of bilateral catheter directed pulmonary arterial lysis for sub massive pulmonary embolism and right-sided heart strain. 2. Markedly elevated pressure measurements within the right main pulmonary artery compatible with pulmonary arterial hypertension. PLAN: -infuse x 12 hours, then repeat pressure measurements and either removal of the catheters or continuation of the catheter directed thrombolysis. Electronically Signed   By: M.D.   On: 01/26/2020 12:01   IR THROMB F/U EVAL ART/VEN FINAL DAY (MS)  Result Date: 01/27/2020 INDICATION: 44 year old with submassive pulmonary embolism. Patient underwent 12 hour catheter directed infusion of tPA for bilateral pulmonary embolism. Patient has completed treatment. EXAM: PULMONARY  ARTERY PRESSURE AFTER CATHETER DIRECTED THROMBOLYSIS COMPARISON:  None. MEDICATIONS: None. ANESTHESIA/SEDATION: None FLUOROSCOPY TIME:  None COMPLICATIONS: None immediate. TECHNIQUE: Right pulmonary artery catheter was pulled back approximately 10 cm. Infusion wire was removed. Pulmonary artery pressure was obtained. After the pulmonary artery pressure, the entire catheter was removed. The left pulmonary artery catheter was completely removed. Both vascular sheaths removed from the right neck. Bandage placed over the puncture site. FINDINGS: Pulmonary artery pressure in the main pulmonary artery is 46/25, mean 35 mmHg IMPRESSION: Completion of catheter directed thrombolysis for bilateral pulmonary emboli. Main pulmonary artery pressure  was essentially unchanged from the pre infusion pressure. Electronically Signed   By: Richarda OverlieAdam  Henn M.D.   On: 01/27/2020 16:13    Labs:  CBC: Recent Labs    01/26/20 2210 01/27/20 0200 01/27/20 0435 01/28/20 0323  WBC 9.4 10.4 9.9 7.8  HGB 12.5 12.5 12.7 11.7*  HCT 38.8 39.0 39.4 36.6  PLT 207 187 206 185    COAGS: No results for input(s): INR, APTT in the last 8760 hours.  BMP: Recent Labs    01/25/20 1647 01/26/20 0152  NA 138 139  K 3.4* 3.6  CL 102 105  CO2 25 21*  GLUCOSE 128* 116*  BUN 13 8  CALCIUM 9.0 9.4  CREATININE 1.23* 0.97  GFRNONAA 55* >60    LIVER FUNCTION TESTS: Recent Labs    01/25/20 1647  BILITOT 0.5  AST 24  ALT 21  ALKPHOS 65  PROT 7.7  ALBUMIN 3.7    Assessment and Plan:  Post PE thrombolysis in IR 10/24 Doing so well Plan per CCM   Electronically Signed: Robet LeuPamela A Tyreek Clabo, PA-C 01/28/2020, 8:45 AM   I spent a total of 15 Minutes at the the patient's bedside AND on the patient's hospital floor or unit, greater than 50% of which was counseling/coordinating care for PE lysis

## 2020-01-28 NOTE — Discharge Summary (Signed)
Physician Discharge Summary  Patient ID: [name] MRN: [id number] DOB/AGE: 03-Aug-1975 44 y.o.  Admit date: 01/25/2020 Discharge date: 01/28/2020  Admission Diagnoses:  Discharge Diagnoses:  Active Problems:   Pulmonary embolism with acute cor pulmonale [facility])   Discharged Condition: good  Hospital Course: Patient was admitted with shortness of breath found to have hypoxemia and submassive PE.  Decision was made to give patient catheter-directed EKOS tpa.  She felt better after this intervention and was able to be weaned to room air.  She walked around unit on room air without symptoms on day of discharge.  She will be sent home with prescription for 6 months eliquis after review of her insurance coverage. 6 month therapy should be adequate for a provoked pulmonary embolus.  Consults: interventional radiology  Significant Diagnostic Studies: radiology: CT scan: submassive PE  Treatments: Catheter-directed PA lysis with low dose TPA  Discharge Exam: Blood pressure (!) 143/92, pulse (!) 103, temperature 98 F (36.7 C), temperature source Oral, resp. rate (!) 24, height 5\' 10"  (1.778 m), weight (!) 144.9 kg, SpO2 95 %. No acute distress. MMM, trachea midline Heart sounds regular, ext warm Trivial LE edema Moves all 4 ext to command Excellent insight No rashes   Disposition: Discharge disposition: 01-Home or Self Care       Discharge Instructions    Call MD for:  difficulty breathing, headache or visual disturbances   Complete by: As directed    Call MD for:  extreme fatigue   Complete by: As directed    Call MD for:  hives   Complete by: As directed    Call MD for:  persistant dizziness or light-headedness   Complete by: As directed    Call MD for:  persistant nausea and vomiting   Complete by: As directed    Call MD for:  redness, tenderness, or signs of infection (pain, swelling, redness, odor or green/yellow discharge around incision site)   Complete  by: As directed    Call MD for:  severe uncontrolled pain   Complete by: As directed    Call MD for:  temperature >100.4   Complete by: As directed    Diet - low sodium heart healthy   Complete by: As directed    Discharge instructions   Complete by: As directed    [name],   You were admitted to the hospital for a blood clot in your lungs called an pulmonary embolism. Treatment while here included blood thinners through your I.V. and directed towards the clot. Due to this being your first blood clot, please begin taking Eliquis (Apixaban) at home twice a day for 6 months. Follow up with your primary care doctor for continued management.   You may experience shortness of breathe as your recover. You are able to resume working out, just make sure to take things slow. We also discussed compression stockings and under desk bike pedal to keep your legs moving to help prevent this occurring in the future.   It was a pleasure meeting you and we wish you the best.   Increase activity slowly   Complete by: As directed      Allergies as of 01/28/2020   No Known Allergies     Medication List    TAKE these medications   apixaban 5 MG Tabs tablet Commonly known as: ELIQUIS Take 1 tablet (5 mg total) by mouth 2 (two) times daily.   cyclobenzaprine 10 MG tablet Commonly known as: FLEXERIL Take 10  mg by mouth 2 (two) times daily as needed.   meloxicam 7.5 MG tablet Commonly known as: MOBIC Take 7.5 mg by mouth daily.   omeprazole 20 MG capsule Commonly known as: PRILOSEC Take 1 capsule (20 mg total) by mouth daily.   oxyCODONE-acetaminophen 5-325 MG tablet Commonly known as: PERCOCET/ROXICET Take 1-2 tablets by mouth every 4 (four) hours as needed for moderate pain.       Follow-up Information    Macy Mis, MD. Schedule an appointment as soon as possible for a visit in 1 week(s).   Specialty: Family Medicine Why: Please make an appointment to see your primary care  provider within the next week  Contact information: [address] [place] [address] [phone]              35 minutes spent arranging discharge.  Signed: Lorin Glass 01/28/2020, 2:34 PM

## 2020-01-31 ENCOUNTER — Other Ambulatory Visit: Payer: Self-pay | Admitting: Family Medicine

## 2020-01-31 ENCOUNTER — Other Ambulatory Visit: Payer: Self-pay

## 2020-01-31 ENCOUNTER — Emergency Department (HOSPITAL_BASED_OUTPATIENT_CLINIC_OR_DEPARTMENT_OTHER)
Admission: EM | Admit: 2020-01-31 | Discharge: 2020-01-31 | Disposition: A | Discharge status: Home / Self Care | Payer: 59 | Attending: Emergency Medicine | Admitting: Emergency Medicine

## 2020-01-31 ENCOUNTER — Encounter (HOSPITAL_BASED_OUTPATIENT_CLINIC_OR_DEPARTMENT_OTHER): Payer: Self-pay | Admitting: *Deleted

## 2020-01-31 DIAGNOSIS — R202 Paresthesia of skin: Secondary | ICD-10-CM | POA: Diagnosis present

## 2020-01-31 DIAGNOSIS — Z7901 Long term (current) use of anticoagulants: Secondary | ICD-10-CM | POA: Insufficient documentation

## 2020-01-31 DIAGNOSIS — Z1231 Encounter for screening mammogram for malignant neoplasm of breast: Secondary | ICD-10-CM

## 2020-01-31 NOTE — ED Notes (Signed)
Discharge instructions discussed with patient. Verbalized understanding of follow up care.

## 2020-01-31 NOTE — Discharge Instructions (Addendum)
Return if you are having any problems. 

## 2020-01-31 NOTE — ED Provider Notes (Signed)
[facility] EMERGENCY DEPARTMENT Provider Note   CSN: [id number] Arrival date & time: 01/31/20  2205   History Chief Complaint  Patient presents with  . Tingling    in the L leg    [name] is a 44 y.o. female.  The history is provided by the patient.  She has history of hyperlipidemia, pulmonary embolism and comes in complaining of tingling in the anterior aspect of her left lower leg which started at 9:20 PM.  Tingling is still present.  She was just discharged from the hospital 3 days ago following admission for pulmonary embolism and is currently anticoagulated on apixaban.  Past Medical History:  Diagnosis Date  . Acid reflux     Patient Active Problem List   Diagnosis Date Noted  . Pulmonary embolism with acute cor pulmonale (HCC) 01/25/2020  . SBO (small bowel obstruction)  06/26/2015  . Small bowel obstruction (HCC) 06/26/2015  . Acid reflux   . Chest wall pain 03/18/2015  . Anxiety, generalized 02/25/2015  . Major depressive disorder 02/25/2015  . Encounter for other specified aftercare 12/24/2014  . 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 Avitaminosis D 03/04/2014  . Back ache 02/07/2014  . HLD (hyperlipidemia) 06/03/2011  . Morbid obesity (HCC) 06/03/2011    Past Surgical History:  Procedure Laterality Date  . ABDOMINAL HYSTERECTOMY    . IR ANGIOGRAM SELECTIVE EACH ADDITIONAL VESSEL  01/26/2020  . IR ANGIOGRAM SELECTIVE EACH ADDITIONAL VESSEL  01/26/2020  . IR INFUSION THROMBOL ARTERIAL INITIAL (MS)  01/26/2020  . IR INFUSION THROMBOL ARTERIAL INITIAL (MS)  01/26/2020  . IR THROMB F/U EVAL ART/VEN FINAL DAY (MS)  01/27/2020  . IR US GUIDE VASC ACCESS RIGHT  01/26/2020  . LAPAROTOMY N/A 06/26/2015   Procedure: EXPLORATORY LAPAROTOMY, LYSIS OF ADHESIONS;  Surgeon: Darnell Level, MD;  Location: [facility] ORS;  Service: General;  Laterality: N/A;     OB History   No obstetric history on file.     No family history on file.  Social History    Tobacco Use  . Smoking status: Never Smoker  . Smokeless tobacco: Never Used  Substance Use Topics  . Alcohol use: No  . Drug use: No    Home Medications Prior to Admission medications   Medication Sig Start Date End Date Taking? Authorizing Provider  apixaban (ELIQUIS) 5 MG TABS tablet Take 1 tablet (5 mg total) by mouth 2 (two) times daily. 01/28/20   Verdene Lennert, MD  cyclobenzaprine (FLEXERIL) 10 MG tablet Take 10 mg by mouth 2 (two) times daily as needed. 10/31/19   [provider]  meloxicam (MOBIC) 7.5 MG tablet Take 7.5 mg by mouth daily. 11/10/19   [provider]  omeprazole (PRILOSEC) 20 MG capsule Take 1 capsule (20 mg total) by mouth daily. 02/22/15   Barrett, Rolm Gala, PA-C  oxyCODONE-acetaminophen (PERCOCET/ROXICET) 5-325 MG tablet Take 1-2 tablets by mouth every 4 (four) hours as needed for moderate pain. 07/03/15   Ashok Norris, NP    Allergies    Patient has no known allergies.  Review of Systems   Review of Systems  All other systems reviewed and are negative.   Physical Exam Updated Vital Signs BP 140/77 (BP Location: Left Arm)   Pulse (!) 124   Temp 98.2 F (36.8 C) (Oral)   Resp (!) 22   SpO2 99%   Physical Exam Vitals and nursing note reviewed.   Morbidly obese 44 year old female, resting comfortably and in no acute distress. Vital  signs are significant for elevated heart rate and respiratory rate. Oxygen saturation is 99%, which is normal. Head is normocephalic and atraumatic. PERRLA, EOMI. Oropharynx is clear. Neck is nontender and supple without adenopathy or JVD. Back is nontender and there is no CVA tenderness. Lungs are clear without rales, wheezes, or rhonchi. Chest is nontender. Heart has regular rate and rhythm without murmur. Abdomen is soft, flat, nontender without masses or hepatosplenomegaly and peristalsis is normoactive. Extremities have no cyanosis or edema, full range of motion is present.  Calf circumference is  symmetric.  No cords are palpable. Skin is warm and dry without rash. Neurologic: Mental status is normal, cranial nerves are intact, there are no motor or sensory deficits.  Careful sensory exam of the entire left lower leg shows no areas of decreased sensation.  ED Results / Procedures / Treatments    Procedures Procedures   Medications Ordered in ED Medications - No data to display  ED Course  I have reviewed the triage vital signs and the nursing notes.  MDM Rules/Calculators/A&P Subjective paresthesia left lower leg.  Normal neurologic exam.  Old records are reviewed confirming recent hospitalization for saddle pulmonary embolism treated with thrombolytics.  No indication of any significant vascular or neurologic process.  Patient reassured and discharged with instructions to follow-up with PCP.  Return precautions discussed.  Of note, triage heart rate is significantly elevated, but heart rate is normal on my exam.  Final Clinical Impression(s) / ED Diagnoses Final diagnoses:  Paresthesia of left leg    Rx / DC Orders ED Discharge Orders    None       Dione Booze, MD 01/31/20 2327

## 2020-01-31 NOTE — ED Triage Notes (Signed)
Pt. Was hospitalized for PE Oct. 23rd 2021 and is on Blood Thinners due to PE.  Pt. Now has tingling in the L leg from mid calf to the ankle since 2120 today/tonight.  No shob and no cough noted.  No pain or injury per Pt.

## 2020-02-12 ENCOUNTER — Encounter (HOSPITAL_BASED_OUTPATIENT_CLINIC_OR_DEPARTMENT_OTHER): Payer: Self-pay

## 2020-02-12 ENCOUNTER — Other Ambulatory Visit: Payer: Self-pay

## 2020-02-12 ENCOUNTER — Emergency Department (HOSPITAL_BASED_OUTPATIENT_CLINIC_OR_DEPARTMENT_OTHER): Payer: 59

## 2020-02-12 ENCOUNTER — Emergency Department (HOSPITAL_BASED_OUTPATIENT_CLINIC_OR_DEPARTMENT_OTHER)
Admission: EM | Admit: 2020-02-12 | Discharge: 2020-02-12 | Disposition: A | Discharge status: Home / Self Care | Payer: 59 | Attending: Emergency Medicine | Admitting: Emergency Medicine

## 2020-02-12 DIAGNOSIS — Z7901 Long term (current) use of anticoagulants: Secondary | ICD-10-CM | POA: Diagnosis not present

## 2020-02-12 DIAGNOSIS — R000 Tachycardia, unspecified: Secondary | ICD-10-CM | POA: Insufficient documentation

## 2020-02-12 HISTORY — DX: Other pulmonary embolism without acute cor pulmonale: I26.99

## 2020-02-12 LAB — CBC WITH DIFFERENTIAL/PLATELET
Abs Immature Granulocytes: 0.03 10*3/uL (ref 0.00–0.07)
Basophils Absolute: 0.1 10*3/uL (ref 0.0–0.1)
Basophils Relative: 1 %
Eosinophils Absolute: 0 10*3/uL (ref 0.0–0.5)
Eosinophils Relative: 0 %
HCT: 40.2 % (ref 36.0–46.0)
Hemoglobin: 12.9 g/dL (ref 12.0–15.0)
Immature Granulocytes: 0 %
Lymphocytes Relative: 26 %
Lymphs Abs: 2.1 10*3/uL (ref 0.7–4.0)
MCH: 26.4 pg (ref 26.0–34.0)
MCHC: 32.1 g/dL (ref 30.0–36.0)
MCV: 82.4 fL (ref 80.0–100.0)
Monocytes Absolute: 0.4 10*3/uL (ref 0.1–1.0)
Monocytes Relative: 5 %
Neutro Abs: 5.6 10*3/uL (ref 1.7–7.7)
Neutrophils Relative %: 68 %
Platelets: 352 10*3/uL (ref 150–400)
RBC: 4.88 MIL/uL (ref 3.87–5.11)
RDW: 13.3 % (ref 11.5–15.5)
WBC: 8.2 10*3/uL (ref 4.0–10.5)
nRBC: 0 % (ref 0.0–0.2)

## 2020-02-12 LAB — COMPREHENSIVE METABOLIC PANEL
ALT: 19 U/L (ref 0–44)
AST: 20 U/L (ref 15–41)
Albumin: 4 g/dL (ref 3.5–5.0)
Alkaline Phosphatase: 66 U/L (ref 38–126)
Anion gap: 10 (ref 5–15)
BUN: 15 mg/dL (ref 6–20)
CO2: 24 mmol/L (ref 22–32)
Calcium: 9.5 mg/dL (ref 8.9–10.3)
Chloride: 102 mmol/L (ref 98–111)
Creatinine, Ser: 1.18 mg/dL — ABNORMAL HIGH (ref 0.44–1.00)
GFR, Estimated: 58 mL/min — ABNORMAL LOW (ref >60)
Glucose, Bld: 108 mg/dL — ABNORMAL HIGH (ref 70–99)
Potassium: 4 mmol/L (ref 3.5–5.1)
Sodium: 136 mmol/L (ref 135–145)
Total Bilirubin: 0.6 mg/dL (ref 0.3–1.2)
Total Protein: 7.5 g/dL (ref 6.5–8.1)

## 2020-02-12 LAB — TSH: TSH: 0.58 u[IU]/mL (ref 0.350–4.500)

## 2020-02-12 LAB — TROPONIN I (HIGH SENSITIVITY): Troponin I (High Sensitivity): 4 ng/L (ref <18)

## 2020-02-12 MED ORDER — SODIUM CHLORIDE 0.9 % IV BOLUS
1000.0000 mL | Freq: Once | INTRAVENOUS | Status: AC
  Administered 2020-02-12: 1000 mL via INTRAVENOUS

## 2020-02-12 MED ORDER — IOHEXOL 350 MG/ML SOLN
100.0000 mL | Freq: Once | INTRAVENOUS | Status: AC | PRN
  Administered 2020-02-12: 100 mL via INTRAVENOUS

## 2020-02-12 NOTE — Discharge Instructions (Addendum)
You were evaluated in the emergency department today for your fast heart rate.   Your laboratory studies, EKG, and CT angiogram of your chest were all very reassuring.   You do not have any new blood clots in your lungs, and most of the previous clots have resolved. There is no sign of strain on your heart as there was prior to treatment of your blood clots.  There is no sign of heart attack or arrhythmia on your EKG.  This is all very reassuring news!  While we do not have a clear cause for your elevated heart rate, we feel you are safe to be discharged home at this time. Please call your primary care doctor tomorrow to be seen in the office soon as possible.  Please keep track of your heart rate at home, you may write them down in a journal, so you may present them to your primary care doctor at your visit.  Please continue to take your eliquis as prescribed.  Please also call the cardiologist office tomorrow to request an appointment sooner than your scheduled December appointment.  You may tell them it is for follow-up visit from the ER.  Please return to the emergency department immediately if you develop any new chest pain, shortness of breath, elevation of your heart rate past today's rate, if you pass out, or if you develop any other new severe symptoms.

## 2020-02-12 NOTE — ED Triage Notes (Signed)
Pt c/o elevated HR when she woke ~6am watch reading 110s-120s also c/o elevated BP-denies pain-states she was recently dx with PE-NAD-steady gait

## 2020-02-12 NOTE — ED Provider Notes (Signed)
[facility] EMERGENCY DEPARTMENT Provider Note   CSN: [id number] Arrival date & time: 02/12/20  1412     History Chief Complaint  Patient presents with  . Tachycardia    [name] is a 44 y.o. female presents with concern for elevated heart rate since this morning. [name] is asymptomatic in regard to her tachycardia, she noticed from his elevated on her watch.  Of note she was recently hospitalized on 01/25/2020 for bilateral proximal occlusive PEs, severe clot burden with right heart strain. (Patient had been in Cam walker x 11 weeks for left Achilles tendon tear.) Patient was admitted and underwent catheter directed thrombolysis, and was discharged home on Eliquis twice daily.  She states that she has not missed any doses of her Eliquis.  She denies chest pain, shortness of breath, dyspnea on exertion, lightheadedness, dizziness, syncope place patient at this time.  Denies calf tenderness to palpation.  She states that she woke this morning and noticed that she felt like her heart was racing, when she checked her heart rate on her apple watch it was in the 120s.  Patient is wearing compression socks.  She is no longer in CAM Walker boot.  She states she returned to work on Monday, feeling a bit anxious about going back to work so soon.  I have personally reviewed this patient's medical records.  She has a history of hyperlipidemia, obesity, MDD, bowel obstruction, GERD.  She is on Eliquis daily, denies any other daily medications.  HPI     Past Medical History:  Diagnosis Date  . Acid reflux   . Pulmonary embolism [facility]     Patient Active Problem List   Diagnosis Date Noted  . Pulmonary embolism with acute cor pulmonale (HCC) 01/25/2020  . SBO (small bowel obstruction)  06/26/2015  . Small bowel obstruction (HCC) 06/26/2015  . Acid reflux   . Chest wall pain 03/18/2015  . Anxiety, generalized 02/25/2015  . Major depressive disorder 02/25/2015  . Encounter  for other specified aftercare 12/24/2014  . 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 Avitaminosis D 03/04/2014  . Back ache 02/07/2014  . HLD (hyperlipidemia) 06/03/2011  . Morbid obesity (HCC) 06/03/2011    Past Surgical History:  Procedure Laterality Date  . ABDOMINAL HYSTERECTOMY    . IR ANGIOGRAM SELECTIVE EACH ADDITIONAL VESSEL  01/26/2020  . IR ANGIOGRAM SELECTIVE EACH ADDITIONAL VESSEL  01/26/2020  . IR INFUSION THROMBOL ARTERIAL INITIAL (MS)  01/26/2020  . IR INFUSION THROMBOL ARTERIAL INITIAL (MS)  01/26/2020  . IR THROMB F/U EVAL ART/VEN FINAL DAY (MS)  01/27/2020  . IR US GUIDE VASC ACCESS RIGHT  01/26/2020  . LAPAROTOMY N/A 06/26/2015   Procedure: EXPLORATORY LAPAROTOMY, LYSIS OF ADHESIONS;  Surgeon: Darnell Level, MD;  Location: [facility] ORS;  Service: General;  Laterality: N/A;     OB History   No obstetric history on file.     No family history on file.  Social History   Tobacco Use  . Smoking status: Never Smoker  . Smokeless tobacco: Never Used  Vaping Use  . Vaping Use: Never used  Substance Use Topics  . Alcohol use: No  . Drug use: No    Home Medications Prior to Admission medications   Medication Sig Start Date End Date Taking? Authorizing Provider  apixaban (ELIQUIS) 5 MG TABS tablet Take 1 tablet (5 mg total) by mouth 2 (two) times daily. 01/28/20   Verdene Lennert, MD  cyclobenzaprine (FLEXERIL) 10 MG tablet Take 10 mg by  mouth 2 (two) times daily as needed. 10/31/19   [provider]  meloxicam (MOBIC) 7.5 MG tablet Take 7.5 mg by mouth daily. 11/10/19   [provider]  omeprazole (PRILOSEC) 20 MG capsule Take 1 capsule (20 mg total) by mouth daily. 02/22/15   Barrett, Rolm Gala, PA-C  oxyCODONE-acetaminophen (PERCOCET/ROXICET) 5-325 MG tablet Take 1-2 tablets by mouth every 4 (four) hours as needed for moderate pain. 07/03/15   Ashok Norris, NP    Allergies    Patient has no known allergies.  Review of Systems   Review of Systems    Constitutional: Negative.   HENT: Negative.   Eyes: Negative.  Negative for visual disturbance.  Respiratory: Negative for cough, chest tightness, shortness of breath and wheezing.   Cardiovascular: Positive for palpitations. Negative for chest pain and leg swelling.  Gastrointestinal: Negative for abdominal pain, diarrhea, nausea and vomiting.  Endocrine: Negative.   Genitourinary: Negative for dysuria, frequency and urgency.  Musculoskeletal: Negative.  Negative for back pain.  Skin: Negative.   Neurological: Negative for dizziness, syncope, facial asymmetry, weakness, light-headedness and headaches.  Hematological: Bruises/bleeds easily.       Recent bilateral PEs following left leg immobilization in a Cam walker x 11 weeks, currently on Eliquis twice daily.    Physical Exam Updated Vital Signs BP 134/79 (BP Location: Left Arm)   Pulse (!) 104   Temp 98.4 F (36.9 C) (Oral)   Resp 16   Ht 5\' 10"  (1.778 m)   Wt (!) 143.6 kg   SpO2 98%   BMI 45.41 kg/m   Physical Exam Vitals and nursing note reviewed.  Constitutional:      Appearance: She is obese.  HENT:     Head: Normocephalic and atraumatic.     Mouth/Throat:     Mouth: Mucous membranes are moist.     Pharynx: No oropharyngeal exudate or posterior oropharyngeal erythema.  Eyes:     General:        Right eye: No discharge.        Left eye: No discharge.     Conjunctiva/sclera: Conjunctivae normal.     Pupils: Pupils are equal, round, and reactive to light.  Cardiovascular:     Rate and Rhythm: Regular rhythm. Tachycardia present.     Pulses:          Radial pulses are 2+ on the right side and 2+ on the left side.       Dorsalis pedis pulses are 1+ on the right side and 1+ on the left side.     Heart sounds: Murmur heard.  Systolic murmur is present with a grade of 1/6.   Pulmonary:     Effort: Pulmonary effort is normal. No respiratory distress.     Breath sounds: Normal breath sounds. No wheezing or rales.   Chest:     Chest wall: No deformity, swelling, crepitus or edema.  Abdominal:     General: There is no distension.     Palpations: Abdomen is soft.     Tenderness: There is no abdominal tenderness.  Musculoskeletal:        General: No deformity.     Cervical back: Neck supple.     Right lower leg: No edema.     Left lower leg: No edema.  Lymphadenopathy:     Cervical: No cervical adenopathy.  Skin:    General: Skin is warm and dry.     Capillary Refill: Capillary refill takes less than 2 seconds.  Neurological:     General: No focal deficit present.     Mental Status: She is alert and oriented to person, place, and time. Mental status is at baseline.     Sensory: Sensation is intact.     Motor: Motor function is intact.  Psychiatric:        Mood and Affect: Mood normal.     ED Results / Procedures / Treatments   Labs (all labs ordered are listed, but only abnormal results are displayed) Labs Reviewed  COMPREHENSIVE METABOLIC PANEL - Abnormal; Notable for the following components:      Result Value   Glucose, Bld 108 (*)    Creatinine, Ser 1.18 (*)    GFR, Estimated 58 (*)    All other components within normal limits  CBC WITH DIFFERENTIAL/PLATELET  TSH  TROPONIN I (HIGH SENSITIVITY)    EKG EKG Interpretation  Date/Time:  Wednesday February 12 2020 14:33:13 EST Ventricular Rate:  119 PR Interval:    QRS Duration: 87 QT Interval:  344 QTC Calculation: 484 R Axis:   88 Text Interpretation: Sinus tachycardia RSR' in V1 or V2, right VCD or RVH Borderline T abnormalities, anterior leads No significant change since last tracing Confirmed by Frederick Peers [id number]) on 02/12/2020 2:44:37 PM   Radiology CT Angio Chest PE W/Cm &/Or Wo Cm  Result Date: 02/12/2020 CLINICAL DATA:  44 year old female with tachycardia. History of pulmonary embolism. EXAM: CT ANGIOGRAPHY CHEST WITH CONTRAST TECHNIQUE: Multidetector CT imaging of the chest was performed using the standard  protocol during bolus administration of intravenous contrast. Multiplanar CT image reconstructions and MIPs were obtained to evaluate the vascular anatomy. CONTRAST:  OMNIPAQUE IOHEXOL 350 MG/ML SOLN COMPARISON:  Chest CT dated 01/25/2020. FINDINGS: Cardiovascular: There is no cardiomegaly or pericardial effusion. The thoracic aorta is unremarkable. Evaluation of the pulmonary arteries is limited due to respiratory motion artifact and suboptimal opacification and timing of the contrast. No definite large or central pulmonary artery embolus identified. Apparent small nonocclusive clot in the right lower lobe subsegmental bifurcation (223/10) likely residual from recent PE. A small recurrent/acute PE is less likely but not excluded. Mediastinum/Nodes: No hilar or mediastinal adenopathy. The esophagus and the thyroid gland are grossly unremarkable. No mediastinal fluid collection. Lungs/Pleura: There is a small focal subpleural density at the right lung base posteriorly (77/5) measuring 2.8 x 0.7 cm. This may represent a focal atelectasis or a small infarct related to prior PE. There is no pleural effusion pneumothorax. The central airways are patent. Upper Abdomen: No acute abnormality. Musculoskeletal: No chest wall abnormality. No acute or significant osseous findings. Review of the MIP images confirms the above findings. IMPRESSION: 1. Tiny nonocclusive clot in the right lower lobe subsegmental branch point, likely residual from prior PE. No large or central pulmonary artery embolus. 2. Probable small focal subpleural atelectasis or a small infarct related to prior PE at the right lung base. Electronically Signed   By: Elgie Collard M.D.   On: 02/12/2020 17:40    Procedures Procedures (including critical care time)  Medications Ordered in ED Medications  sodium chloride 0.9 % bolus 1,000 mL (0 mLs Intravenous Stopped 02/12/20 1755)  iohexol (OMNIPAQUE) 350 MG/ML injection 100 mL (100 mLs  Intravenous Contrast Given 02/12/20 1654)    ED Course  I have reviewed the triage vital signs and the nursing notes.  Pertinent labs & imaging results that were available during my care of the patient were reviewed by me and considered in my medical  decision making (see chart for details).    MDM Rules/Calculators/A&P                         44 year old female with recent diagnosis of bilateral PEs who underwent thrombolysis, currently on Eliquis twice daily, who presents for 1 day of tachycardia, heart rate in the 120s at home.  Differential diagnosis for this patient includes but is not limited to new PE, electrolyte derangement, thyroid dysfunction, ACS, arrhythmia, anxiety.     Patient is tachycardic on intake heart rate 123.  Vital signs otherwise normal.  Physical exam significant for tachycardia to the 120s when the patient is talking, improves to the 105-110 range when she is not talking.  Grade 1 systolic murmur, patient states this is not new for her.  Pulmonary exam is normal.  No calf tenderness palpation, strong symmetric distal pulses.  EKG with sinus tachycardia, borderline T wave abnormalities in the anterior leads, no significant change from prior EKG.  CBC, CMP, troponin, TSH ordered.  Pending lab results will likely proceed with repeat CT angiogram of the chest.  CBC unremarked well, CMP with mild AKI creatinine 1.18.  Will administer IV fluids.  Troponin negative, 4.  TSH normal, 0.580  Laboratory and EKG studies without clear etiology for patient's tachycardia.  We will proceed with CT angiogram of the chest.  CTA of the chest with tiny nonocclusive clot in the right lower lobe subsegmental branch point, likely residual from prior PE.  No large or central pulmonary artery embolus. Probable small focal subpleural atelectasis or a small infarct related to prior PE at the right lung base.  No clear etiology for this patient's tachycardia.  No evidence of heart  strain based on her work-up today.   At the time of my reevaluation of this patient, she is resting comfortably in her room, her daughter is now at the bedside.  Patient remains asymptomatic from her tachycardia.  Patient has remained tachycardic on cardiac monitoring since arrival to the emergency department, heart rate ranging between 105 to 120 bpm.  Case discussed with attending physician, Dr. Anitra LauthPlunkett.  Given reassuring laboratory studies, EKG, CT angiogram of the chest, I do feel any further work-up is necessary in the emergency department at this time.  Recommend close follow-up with cardiology, patient should call her primary care doctor first thing tomorrow morning to be seen in the office.  Discussed symptom journal, with record of heart rate throughout the day.  Patient is to call her cardiologist tomorrow to see if she can move her appointment up from March 09, 2020 to sometime in the next week.  Nicole HerterShannon voiced understanding of her medical evaluation and treatment plan.  Each of her questions were answered to her expressed satisfaction.  Strict return precautions were given.  Patient is stable for discharge.  Final Clinical Impression(s) / ED Diagnoses Final diagnoses:  Tachycardia    Rx / DC Orders ED Discharge Orders    None       Sherrilee GillesSponseller, Kentley Blyden R, PA-C 02/12/20 1941    Gwyneth SproutPlunkett, Whitney, MD 02/13/20 1814

## 2020-04-02 ENCOUNTER — Ambulatory Visit: Payer: 59

## 2020-05-04 ENCOUNTER — Ambulatory Visit
Admission: RE | Admit: 2020-05-04 | Discharge: 2020-05-04 | Disposition: A | Discharge status: Home / Self Care | Payer: 59 | Source: Ambulatory Visit | Attending: Family Medicine | Admitting: Family Medicine

## 2020-05-04 ENCOUNTER — Other Ambulatory Visit: Payer: Self-pay

## 2020-05-04 DIAGNOSIS — Z1231 Encounter for screening mammogram for malignant neoplasm of breast: Secondary | ICD-10-CM

## 2020-11-08 ENCOUNTER — Other Ambulatory Visit: Payer: Self-pay

## 2020-11-08 ENCOUNTER — Encounter (HOSPITAL_BASED_OUTPATIENT_CLINIC_OR_DEPARTMENT_OTHER): Payer: Self-pay

## 2020-11-08 ENCOUNTER — Emergency Department (HOSPITAL_BASED_OUTPATIENT_CLINIC_OR_DEPARTMENT_OTHER)
Admission: EM | Admit: 2020-11-08 | Discharge: 2020-11-08 | Disposition: A | Discharge status: Home / Self Care | Payer: 59 | Attending: Emergency Medicine | Admitting: Emergency Medicine

## 2020-11-08 ENCOUNTER — Emergency Department (HOSPITAL_BASED_OUTPATIENT_CLINIC_OR_DEPARTMENT_OTHER): Payer: 59

## 2020-11-08 DIAGNOSIS — R000 Tachycardia, unspecified: Secondary | ICD-10-CM | POA: Diagnosis not present

## 2020-11-08 DIAGNOSIS — R0602 Shortness of breath: Secondary | ICD-10-CM | POA: Diagnosis present

## 2020-11-08 DIAGNOSIS — Z7901 Long term (current) use of anticoagulants: Secondary | ICD-10-CM | POA: Diagnosis not present

## 2020-11-08 LAB — CBC WITH DIFFERENTIAL/PLATELET
Abs Immature Granulocytes: 0.02 10*3/uL (ref 0.00–0.07)
Basophils Absolute: 0 10*3/uL (ref 0.0–0.1)
Basophils Relative: 1 %
Eosinophils Absolute: 0.2 10*3/uL (ref 0.0–0.5)
Eosinophils Relative: 2 %
HCT: 38.9 % (ref 36.0–46.0)
Hemoglobin: 12.6 g/dL (ref 12.0–15.0)
Immature Granulocytes: 0 %
Lymphocytes Relative: 44 %
Lymphs Abs: 3.1 10*3/uL (ref 0.7–4.0)
MCH: 26.8 pg (ref 26.0–34.0)
MCHC: 32.4 g/dL (ref 30.0–36.0)
MCV: 82.6 fL (ref 80.0–100.0)
Monocytes Absolute: 0.4 10*3/uL (ref 0.1–1.0)
Monocytes Relative: 6 %
Neutro Abs: 3.3 10*3/uL (ref 1.7–7.7)
Neutrophils Relative %: 47 %
Platelets: 266 10*3/uL (ref 150–400)
RBC: 4.71 MIL/uL (ref 3.87–5.11)
RDW: 13.5 % (ref 11.5–15.5)
WBC: 7.1 10*3/uL (ref 4.0–10.5)
nRBC: 0 % (ref 0.0–0.2)

## 2020-11-08 LAB — TROPONIN I (HIGH SENSITIVITY)
Troponin I (High Sensitivity): 2 ng/L (ref <18)
Troponin I (High Sensitivity): 2 ng/L (ref <18)

## 2020-11-08 LAB — COMPREHENSIVE METABOLIC PANEL
ALT: 18 U/L (ref 0–44)
AST: 23 U/L (ref 15–41)
Albumin: 3.7 g/dL (ref 3.5–5.0)
Alkaline Phosphatase: 74 U/L (ref 38–126)
Anion gap: 9 (ref 5–15)
BUN: 14 mg/dL (ref 6–20)
CO2: 25 mmol/L (ref 22–32)
Calcium: 8.9 mg/dL (ref 8.9–10.3)
Chloride: 104 mmol/L (ref 98–111)
Creatinine, Ser: 1.01 mg/dL — ABNORMAL HIGH (ref 0.44–1.00)
GFR, Estimated: >60 mL/min (ref >60)
Glucose, Bld: 106 mg/dL — ABNORMAL HIGH (ref 70–99)
Potassium: 4 mmol/L (ref 3.5–5.1)
Sodium: 138 mmol/L (ref 135–145)
Total Bilirubin: 0.5 mg/dL (ref 0.3–1.2)
Total Protein: 7.2 g/dL (ref 6.5–8.1)

## 2020-11-08 LAB — BRAIN NATRIURETIC PEPTIDE: B Natriuretic Peptide: 20.2 pg/mL (ref 0.0–100.0)

## 2020-11-08 MED ORDER — IOHEXOL 350 MG/ML SOLN
100.0000 mL | Freq: Once | INTRAVENOUS | Status: AC | PRN
  Administered 2020-11-08: 72 mL via INTRAVENOUS

## 2020-11-08 NOTE — Discharge Instructions (Addendum)
Follow-up with your doctor within the next couple of days.  Return here as needed for any worsening symptoms

## 2020-11-08 NOTE — ED Notes (Signed)
Patient transported to CT 

## 2020-11-08 NOTE — ED Notes (Signed)
CT on hold until ED RN can get an IV; per EDRN, the current line is not sufficient for administration of contrast under power injection for CTA; EDRN will attempt another line and notify CT when ready for scan.

## 2020-11-08 NOTE — ED Provider Notes (Signed)
Care was taken over from Dr. Preston Fleeting.  Patient presented with shortness of breath and tachycardia.  No arrhythmias have been noted in the ED.  She had a CT scan which shows no evidence of PE.  Her labs are nonconcerning.  She has no anemia.  No suggestions of infection.  No evidence of pneumonia.  She is feeling better.  She is walk to the bathroom and says she does not feel short of breath like she did before.  Her heart rate has improved.  It is currently in the 90s.  No clear etiology for the event although her symptoms seem to have resolved.  She was encouraged to follow-up with her PCP within the next few days.  Return precautions were given.   Rolan Bucco, MD 11/08/20 1009

## 2020-11-08 NOTE — ED Provider Notes (Signed)
[facility] EMERGENCY DEPARTMENT Provider Note   CSN: [id number] Arrival date & time: 11/08/20  0516     History Chief Complaint  Patient presents with   Shortness of Breath    [name] is a 45 y.o. female.  The history is provided by the patient.  Shortness of Breath She has history of hyperlipidemia, pulmonary embolism and comes in because of shortness of breath.  She noted that she was short of breath when walking only a few feet to her bathroom.  Her watch showed that her heart rate got up as high as 142.  She denies chest pain, heaviness, tightness, pressure.  She denies cough.  She denies nausea, vomiting, diaphoresis.  Symptoms are somewhat similar to what she had with her pulmonary embolism.  She was on anticoagulants for 6 months, discontinued in June.   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   Avitaminosis D 03/04/2014   Back ache 02/07/2014   HLD (hyperlipidemia) 06/03/2011   Morbid obesity (HCC) 06/03/2011    Past Surgical History:  Procedure Laterality Date   ABDOMINAL HYSTERECTOMY     IR ANGIOGRAM SELECTIVE EACH ADDITIONAL VESSEL  01/26/2020   IR ANGIOGRAM SELECTIVE EACH ADDITIONAL VESSEL  01/26/2020   IR INFUSION THROMBOL ARTERIAL INITIAL (MS)  01/26/2020   IR INFUSION THROMBOL ARTERIAL INITIAL (MS)  01/26/2020   IR THROMB F/U EVAL ART/VEN FINAL DAY (MS)  01/27/2020   IR US GUIDE VASC ACCESS RIGHT  01/26/2020   LAPAROTOMY N/A 06/26/2015   Procedure: EXPLORATORY LAPAROTOMY, LYSIS OF ADHESIONS;   Surgeon: Darnell Level, MD;  Location: [facility] ORS;  Service: General;  Laterality: N/A;     OB History   No obstetric history on file.     No family history on file.  Social History   Tobacco Use   Smoking status: Never   Smokeless tobacco: Never  Vaping Use   Vaping Use: Never used  Substance Use Topics   Alcohol use: No   Drug use: No    Home Medications Prior to Admission medications   Medication Sig Start Date End Date Taking? Authorizing Provider  apixaban (ELIQUIS) 5 MG TABS tablet TAKE 1 TABLET (5 MG TOTAL) BY MOUTH TWO TIMES DAILY. 01/28/20 01/27/21  Verdene Lennert, MD  cyclobenzaprine (FLEXERIL) 10 MG tablet Take 10 mg by mouth 2 (two) times daily as needed. 10/31/19   [provider]  meloxicam (MOBIC) 7.5 MG tablet Take 7.5 mg by mouth daily. 11/10/19   [provider]  omeprazole (PRILOSEC) 20 MG capsule Take 1 capsule (20 mg total) by mouth daily. 02/22/15   Barrett, Rolm Gala, PA-C  oxyCODONE-acetaminophen (PERCOCET/ROXICET) 5-325 MG tablet Take 1-2 tablets by mouth every 4 (four) hours as needed for moderate pain. 07/03/15   Ashok Norris, NP    Allergies    Patient has no known allergies.  Review of Systems   Review of Systems  Respiratory:  Positive for shortness of breath.   All other systems reviewed and are negative.  Physical Exam  Updated Vital Signs BP (!) 143/78 (BP Location: Right Arm)   Pulse (!) 108   Temp 98 F (36.7 C) (Oral)   Resp 13   Ht 5\' 10"  (1.778 m)   Wt (!) 148.3 kg   SpO2 100%   BMI 46.92 kg/m   Physical Exam Vitals and nursing note reviewed.  45 year old female, resting comfortably and in no acute distress. Vital signs are significant for elevated heart rate and borderline elevated blood pressure. Oxygen saturation is 100%, which is normal. Head is normocephalic and atraumatic. PERRLA, EOMI. Oropharynx is clear. Neck is nontender and supple without adenopathy or JVD. Back is nontender and there is no CVA  tenderness. Lungs are clear without rales, wheezes, or rhonchi. Chest is nontender. Heart has regular rate and rhythm without murmur. Abdomen is soft, flat, nontender without masses or hepatosplenomegaly and peristalsis is normoactive. Extremities have 2+ edema, full range of motion is present. Skin is warm and dry without rash. Neurologic: Mental status is normal, cranial nerves are intact, there are no motor or sensory deficits.  ED Results / Procedures / Treatments   Labs (all labs ordered are listed, but only abnormal results are displayed) Labs Reviewed  COMPREHENSIVE METABOLIC PANEL - Abnormal; Notable for the following components:      Result Value   Glucose, Bld 106 (*)    Creatinine, Ser 1.01 (*)    All other components within normal limits  BRAIN NATRIURETIC PEPTIDE  CBC WITH DIFFERENTIAL/PLATELET  TROPONIN I (HIGH SENSITIVITY)    EKG EKG Interpretation  Date/Time:  Sunday November 08 2020 05:27:08 EDT Ventricular Rate:  110 PR Interval:  154 QRS Duration: 100 QT Interval:  348 QTC Calculation: 471 R Axis:   87 Text Interpretation: Sinus tachycardia RSR' in V1 or V2, right VCD or RVH When compared with ECG of 02/12/2020, No significant change was found Confirmed by 13/01/2020 (Dione Booze) on 11/08/2020 5:53:22 AM  Radiology DG Chest Port 1 View  Result Date: 11/08/2020 CLINICAL DATA:  45 year old female with history of shortness of breath. EXAM: PORTABLE CHEST 1 VIEW COMPARISON:  Chest x-ray 01/25/2020. FINDINGS: Lung volumes are normal. No consolidative airspace disease. No pleural effusions. No pneumothorax. No pulmonary nodule or mass noted. Pulmonary vasculature and the cardiomediastinal silhouette are within normal limits. IMPRESSION: No radiographic evidence of acute cardiopulmonary disease. Electronically Signed   By: 01/27/2020 M.D.   On: 11/08/2020 06:33    Procedures Procedures   Medications Ordered in ED Medications - No data to display  ED Course  I  have reviewed the triage vital signs and the nursing notes.  Pertinent labs & imaging results that were available during my care of the patient were reviewed by me and considered in my medical decision making (see chart for details).   MDM Rules/Calculators/A&P                         Dyspnea of uncertain cause.  Peripheral edema would be consistent with heart failure, but patient has no history of heart failure.  Tachycardia is consistent with pulmonary embolism, will check D-dimer and will also check BNP.  We will also check troponin to rule out occult myocardial infarction.  ECG shows sinus tachycardia but no ST or T changes.  Will check chest x-ray to rule out pneumonia although she has no cough or fever.  Old records are reviewed confirming hospitalization for pulmonary embolism last October.  That pulmonary embolism was provoked by  leg immobilization because of an Achilles tendon injury.  Chest x-ray shows no evidence of heart failure, BNP and troponin are normal.  CBC and metabolic panel are unremarkable.  Case is signed out to Dr. Fredderick Phenix.  Final Clinical Impression(s) / ED Diagnoses Final diagnoses:  SOB (shortness of breath)    Rx / DC Orders ED Discharge Orders     None        Dione Booze, MD 11/08/20 [phone]

## 2020-11-08 NOTE — ED Triage Notes (Signed)
Pt arrives POV with c/o sudden shob that began last night after getting a massage. Pt reports hx of Pe's and admission in 2021. Sts a similar feeling. Denies pain. Dyspnea and tachycardia on exertion.

## 2021-01-27 ENCOUNTER — Emergency Department (HOSPITAL_BASED_OUTPATIENT_CLINIC_OR_DEPARTMENT_OTHER): Payer: 59

## 2021-01-27 ENCOUNTER — Encounter (HOSPITAL_BASED_OUTPATIENT_CLINIC_OR_DEPARTMENT_OTHER): Payer: Self-pay

## 2021-01-27 ENCOUNTER — Emergency Department (HOSPITAL_BASED_OUTPATIENT_CLINIC_OR_DEPARTMENT_OTHER)
Admission: EM | Admit: 2021-01-27 | Discharge: 2021-01-27 | Disposition: A | Discharge status: Home / Self Care | Payer: 59 | Attending: Emergency Medicine | Admitting: Emergency Medicine

## 2021-01-27 ENCOUNTER — Other Ambulatory Visit: Payer: Self-pay

## 2021-01-27 DIAGNOSIS — E860 Dehydration: Secondary | ICD-10-CM | POA: Insufficient documentation

## 2021-01-27 DIAGNOSIS — R0789 Other chest pain: Secondary | ICD-10-CM | POA: Diagnosis present

## 2021-01-27 DIAGNOSIS — R600 Localized edema: Secondary | ICD-10-CM | POA: Diagnosis not present

## 2021-01-27 DIAGNOSIS — Z79899 Other long term (current) drug therapy: Secondary | ICD-10-CM | POA: Diagnosis not present

## 2021-01-27 DIAGNOSIS — Z7901 Long term (current) use of anticoagulants: Secondary | ICD-10-CM | POA: Insufficient documentation

## 2021-01-27 DIAGNOSIS — I10 Essential (primary) hypertension: Secondary | ICD-10-CM | POA: Diagnosis not present

## 2021-01-27 LAB — CBC
HCT: 37.9 % (ref 36.0–46.0)
Hemoglobin: 12.3 g/dL (ref 12.0–15.0)
MCH: 26.7 pg (ref 26.0–34.0)
MCHC: 32.5 g/dL (ref 30.0–36.0)
MCV: 82.4 fL (ref 80.0–100.0)
Platelets: 257 10*3/uL (ref 150–400)
RBC: 4.6 MIL/uL (ref 3.87–5.11)
RDW: 13.5 % (ref 11.5–15.5)
WBC: 9 10*3/uL (ref 4.0–10.5)
nRBC: 0 % (ref 0.0–0.2)

## 2021-01-27 LAB — BASIC METABOLIC PANEL
Anion gap: 8 (ref 5–15)
BUN: 17 mg/dL (ref 6–20)
CO2: 25 mmol/L (ref 22–32)
Calcium: 9 mg/dL (ref 8.9–10.3)
Chloride: 103 mmol/L (ref 98–111)
Creatinine, Ser: 1.04 mg/dL — ABNORMAL HIGH (ref 0.44–1.00)
GFR, Estimated: >60 mL/min (ref >60)
Glucose, Bld: 114 mg/dL — ABNORMAL HIGH (ref 70–99)
Potassium: 3.7 mmol/L (ref 3.5–5.1)
Sodium: 136 mmol/L (ref 135–145)

## 2021-01-27 LAB — TROPONIN I (HIGH SENSITIVITY)
Troponin I (High Sensitivity): 4 ng/L (ref <18)
Troponin I (High Sensitivity): 4 ng/L (ref <18)

## 2021-01-27 MED ORDER — SODIUM CHLORIDE 0.9 % IV BOLUS
1000.0000 mL | Freq: Once | INTRAVENOUS | Status: AC
  Administered 2021-01-27: 1000 mL via INTRAVENOUS

## 2021-01-27 MED ORDER — IOHEXOL 350 MG/ML SOLN
100.0000 mL | Freq: Once | INTRAVENOUS | Status: AC | PRN
  Administered 2021-01-27: 22:00:00 100 mL via INTRAVENOUS

## 2021-01-27 NOTE — Discharge Instructions (Addendum)
Please follow up with your cardiologist regarding elevated blood pressure and heart rate

## 2021-01-27 NOTE — ED Notes (Signed)
Discussed case with EDP-orders received  

## 2021-01-27 NOTE — ED Notes (Signed)
2nf RN IV attempt unsuccessful-RT at [facility] with [place] for IV attempt

## 2021-01-27 NOTE — ED Triage Notes (Signed)
Pt c/o CP, tingling to bilat LE x 3 days-states she was seen by PCP today-had outpt bilat LE venous US just PTA-states she has hx of PE-NAD-steady gait

## 2021-01-27 NOTE — ED Notes (Signed)
Pt states she is a difficult stick with [place] normally being used-unable to appreciate accessible vein due to IV access policy for CT chest-2nd RN to assess asap-pt aware-NAD

## 2021-01-27 NOTE — ED Provider Notes (Signed)
[facility] EMERGENCY DEPARTMENT Provider Note   CSN: [id number] Arrival date & time: 01/27/21  1912     History Chief Complaint  Patient presents with   Chest Pain    GORGEOUS NEWLUN is a 45 y.o. female.  This is a 45 y.o. female with significant medical history as below, including DVT/PE who presents to the ED with complaint of chest heaviness.  Patient reports  recently completed a lower extremity duplex this evening as she was having increased swelling to her lower extremities.  This was reported negative by the patient  Location: Midsternal Duration: 1 hour prior to arrival Onset: Sudden Timing: Constant Description: Aching, heaviness, similar to prior PE Severity: Mild Exacerbating/Alleviating Factors: Worse With exertion Associated Symptoms: Lower extremity swelling Pertinent Negatives: No fevers, chills, dyspnea, lightheadedness, recent travel or sick contacts  Context: Patient no longer taking DOAC as she was cleared by hematology.  Prior PE was believed to be provoked by leg injury   The history is provided by the patient. No language interpreter was used.  Chest Pain Associated symptoms: no abdominal pain, no back pain, no cough, no dysphagia, no fever, no headache, no nausea, no palpitations and no shortness of breath    HPI: A 45 year old patient with a history of hypertension, hypercholesterolemia and obesity presents for evaluation of chest pain. Initial onset of pain was approximately 1-3 hours ago. The patient's chest pain is described as heaviness/pressure/tightness and is not worse with exertion. The patient's chest pain is not middle- or left-sided, is not well-localized, is not sharp and does not radiate to the arms/jaw/neck. The patient does not complain of nausea and denies diaphoresis. The patient has no history of stroke, has no history of peripheral artery disease, has not smoked in the past 90 days, denies any history of treated diabetes and  has no relevant family history of coronary artery disease (first degree relative at less than age 62).   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   Avitaminosis D 03/04/2014   Back ache 02/07/2014   HLD (hyperlipidemia) 06/03/2011   Morbid obesity (HCC) 06/03/2011    Past Surgical History:  Procedure Laterality Date   ABDOMINAL HYSTERECTOMY     IR ANGIOGRAM SELECTIVE EACH ADDITIONAL VESSEL  01/26/2020   IR ANGIOGRAM SELECTIVE EACH ADDITIONAL VESSEL  01/26/2020   IR INFUSION THROMBOL ARTERIAL INITIAL (MS)  01/26/2020   IR INFUSION THROMBOL ARTERIAL INITIAL (MS)  01/26/2020   IR THROMB F/U EVAL ART/VEN FINAL DAY (MS)  01/27/2020   IR US GUIDE VASC ACCESS RIGHT  01/26/2020   LAPAROTOMY N/A 06/26/2015   Procedure: EXPLORATORY LAPAROTOMY, LYSIS OF ADHESIONS;  Surgeon: Darnell Level, MD;  Location: [facility] ORS;  Service: General;  Laterality: N/A;     OB History   No obstetric history on file.     No family history on file.  Social History   Tobacco Use   Smoking status: Never   Smokeless tobacco: Never  Vaping Use   Vaping Use: Never used  Substance  Use Topics   Alcohol use: No   Drug use: No    Home Medications Prior to Admission medications   Medication Sig Start Date End Date Taking? Authorizing Provider  apixaban (ELIQUIS) 5 MG TABS tablet TAKE 1 TABLET (5 MG TOTAL) BY MOUTH TWO TIMES DAILY. 01/28/20 01/27/21  Verdene Lennert, MD  cyclobenzaprine (FLEXERIL) 10 MG tablet Take 10 mg by mouth 2 (two) times daily as needed.  10/31/19   [provider]  meloxicam (MOBIC) 7.5 MG tablet Take 7.5 mg by mouth daily. 11/10/19   [provider]  omeprazole (PRILOSEC) 20 MG capsule Take 1 capsule (20 mg total) by mouth daily. 02/22/15   Barrett, Rolm Gala, PA-C  oxyCODONE-acetaminophen (PERCOCET/ROXICET) 5-325 MG tablet Take 1-2 tablets by mouth every 4 (four) hours as needed for moderate pain. 07/03/15   Ashok Norris, NP    Allergies    Patient has no known allergies.  Review of Systems   Review of Systems  Constitutional:  Negative for activity change and fever.  HENT:  Negative for facial swelling and trouble swallowing.   Eyes:  Negative for discharge and redness.  Respiratory:  Negative for cough and shortness of breath.   Cardiovascular:  Positive for chest pain and leg swelling. Negative for palpitations.  Gastrointestinal:  Negative for abdominal pain and nausea.  Genitourinary:  Negative for dysuria and flank pain.  Musculoskeletal:  Negative for back pain and gait problem.  Skin:  Negative for pallor and rash.  Neurological:  Negative for syncope and headaches.   Physical Exam Updated Vital Signs BP (!) 144/83   Pulse (!) 101   Temp 98.5 F (36.9 C) (Oral)   Resp 15   SpO2 97%   Physical Exam Vitals and nursing note reviewed.  Constitutional:      General: She is not in acute distress.    Appearance: Normal appearance.  HENT:     Head: Normocephalic and atraumatic.     Right Ear: External ear normal.     Left Ear: External ear normal.     Nose: Nose normal.     Mouth/Throat:     Mouth: Mucous membranes are moist.  Eyes:     General: No scleral icterus.       Right eye: No discharge.        Left eye: No discharge.  Cardiovascular:     Rate and Rhythm: Regular rhythm. Tachycardia present.     Pulses: Normal pulses.     Heart sounds: Normal heart sounds.  Pulmonary:     Effort: Pulmonary effort is normal. No respiratory distress.     Breath sounds: Normal breath sounds.   Abdominal:     General: Abdomen is flat.     Palpations: Abdomen is soft.     Tenderness: There is no abdominal tenderness.  Musculoskeletal:        General: Normal range of motion.     Cervical back: Normal range of motion.     Right lower leg: Edema present.     Left lower leg: Edema present.  Skin:    General: Skin is warm and dry.     Capillary Refill: Capillary refill takes less than 2 seconds.  Neurological:     Mental Status: She is alert and oriented to person, place, and time.     GCS: GCS eye subscore is 4. GCS verbal subscore is 5. GCS motor subscore is 6.  Psychiatric:        Mood and Affect: Mood  normal.        Behavior: Behavior normal.    ED Results / Procedures / Treatments   Labs (all labs ordered are listed, but only abnormal results are displayed) Labs Reviewed  BASIC METABOLIC PANEL - Abnormal; Notable for the following components:      Result Value   Glucose, Bld 114 (*)    Creatinine, Ser 1.04 (*)    All other components within normal limits  CBC  TROPONIN I (HIGH SENSITIVITY)  TROPONIN I (HIGH SENSITIVITY)    EKG EKG Interpretation  Date/Time:  Wednesday January 27 2021 19:34:44 EDT Ventricular Rate:  122 PR Interval:  150 QRS Duration: 84 QT Interval:  326 QTC Calculation: 464 R Axis:   96 Text Interpretation: Sinus tachycardia Rightward axis Borderline ECG No STEMI Confirmed by Tanda Rockers ([id number]) on 01/27/2021 7:41:30 PM  Radiology CT Angio Chest PE W and/or Wo Contrast  Result Date: 01/27/2021 CLINICAL DATA:  Tingling to the lower extremities EXAM: CT ANGIOGRAPHY CHEST WITH CONTRAST TECHNIQUE: Multidetector CT imaging of the chest was performed using the standard protocol during bolus administration of intravenous contrast. Multiplanar CT image reconstructions and MIPs were obtained to evaluate the vascular anatomy. CONTRAST:  OMNIPAQUE IOHEXOL 350 MG/ML SOLN COMPARISON:  CT chest 11/08/2020 FINDINGS: Cardiovascular: Satisfactory  opacification of the pulmonary arteries to the segmental level. No evidence of pulmonary embolism. Normal heart size. No pericardial effusion. Nonaneurysmal aorta. No dissection seen. Mediastinum/Nodes: No enlarged mediastinal, hilar, or axillary lymph nodes. Thyroid gland, trachea, and esophagus demonstrate no significant findings. Lungs/Pleura: Lungs are clear. No pleural effusion or pneumothorax. Upper Abdomen: No acute abnormality. Musculoskeletal: No chest wall abnormality. No acute or significant osseous findings. Review of the MIP images confirms the above findings. IMPRESSION: No CT evidence for acute pulmonary embolus or aortic dissection. Clear lung fields. Electronically Signed   By: Jasmine Pang M.D.   On: 01/27/2021 22:28    Procedures Procedures   Medications Ordered in ED Medications  iohexol (OMNIPAQUE) 350 MG/ML injection 100 mL (100 mLs Intravenous Contrast Given 01/27/21 2200)  sodium chloride 0.9 % bolus 1,000 mL (1,000 mLs Intravenous New Bag/Given 01/27/21 2224)    ED Course  I have reviewed the triage vital signs and the nursing notes.  Pertinent labs & imaging results that were available during my care of the patient were reviewed by me and considered in my medical decision making (see chart for details).    MDM Rules/Calculators/A&P HEAR Score: 3                         CC: Chest heaviness  This patient complains of chest heaviness; this involves an extensive number of treatment options and is a complaint that carries with it a high risk of complications and morbidity. Vital signs were reviewed. Serious etiologies considered.  Wells score is high, history of prior PE, symptoms distant with PE.  Will obtain CT PE  Record review:  Previous records obtained and reviewed  Work up as above, notable for:   Labs & imaging results that were available during my care of the patient were reviewed by me and considered in my medical decision making.   I ordered imaging  studies which included CT PE and I independently visualized and interpreted imaging which showed no acute process  Management: Given IVF  Reassessment:  Patient does report she is feeling much better, her HR has improved, she is tolerating oral intake.  She has no ongoing  chest pain, palpitations, dyspnea, diaphoresis.  She feels as though she is returned to her baseline. Advised pt to f/u with cardiologist regarding elevated blood pressure reading and tachycardia.      Patient in no distress and overall condition improved here in the ED. Troponin negative x2. CXR reviewed. Labs without demonstration of acute pathology unless otherwise noted above. Low HEART Score: 0-3 points (0.9-1.7% risk of MACE). Given the extremely low risk further testing and evaluation as an inpatient does not appear to be indicated at this time. Detailed discussions were had with the patient regarding current findings, and is agreeable to follow up with their PCP or cardiologist within the next few days. The patient has been instructed to return immediately if the symptoms worsen in any way for re-evaluation. Patient verbalized understanding and is in agreement with current care plan. All questions answered prior to discharge.    This chart was dictated using voice recognition software.  Despite best efforts to proofread,  errors can occur which can change the documentation meaning.  Final Clinical Impression(s) / ED Diagnoses Final diagnoses:  Atypical chest pain  Mild dehydration    Rx / DC Orders ED Discharge Orders     None        Sloan Leiter, DO 01/27/21 2327

## 2021-01-27 NOTE — ED Notes (Signed)
Pt up to bathroom and back to bed.

## 2021-01-27 NOTE — ED Notes (Signed)
Pt c/o chest pain that radiated to middle of her shoulder blades today; pt states she was sitting watching TV when the pain started

## 2021-04-07 ENCOUNTER — Other Ambulatory Visit: Payer: Self-pay | Admitting: Family Medicine

## 2021-04-07 DIAGNOSIS — Z1231 Encounter for screening mammogram for malignant neoplasm of breast: Secondary | ICD-10-CM

## 2021-05-05 ENCOUNTER — Ambulatory Visit
Admission: RE | Admit: 2021-05-05 | Discharge: 2021-05-05 | Disposition: A | Discharge status: Home / Self Care | Payer: 59 | Source: Ambulatory Visit

## 2021-05-05 DIAGNOSIS — Z1231 Encounter for screening mammogram for malignant neoplasm of breast: Secondary | ICD-10-CM

## 2021-11-14 IMAGING — MG MM DIGITAL SCREENING BILAT W/ TOMO AND CAD
6 of 12 series · 6 of 36 positions shown · non-contrast
Comparison: Previous exam(s).

CLINICAL DATA: Screening.

EXAM:
DIGITAL SCREENING BILATERAL MAMMOGRAM WITH TOMO AND CAD

[R MLO synth-2D (1 of 2)]
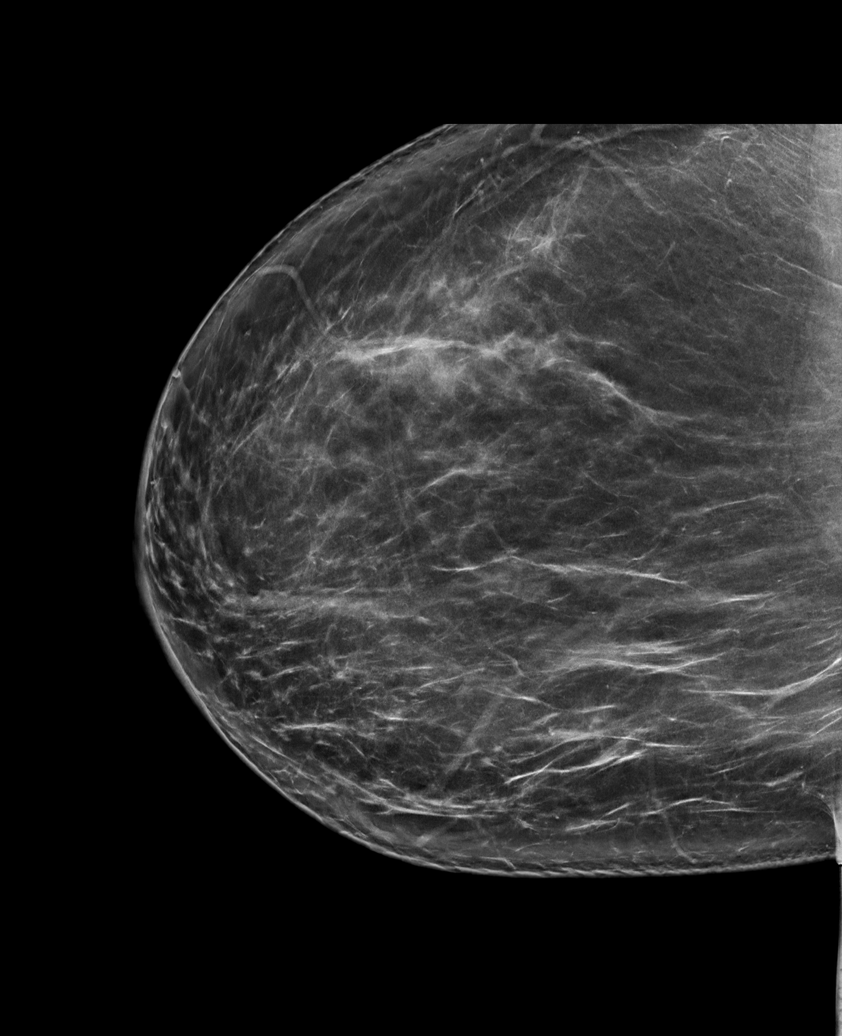

[R MLO synth-2D (2 of 2)]
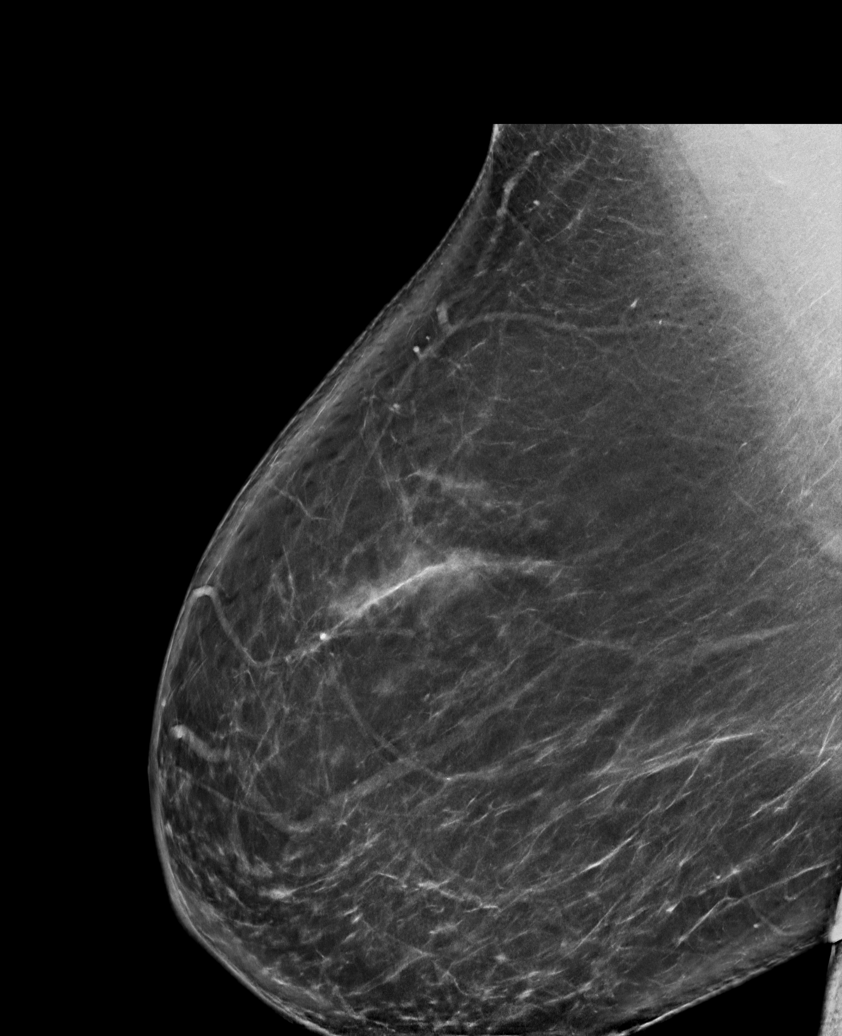

[R CC synth-2D]
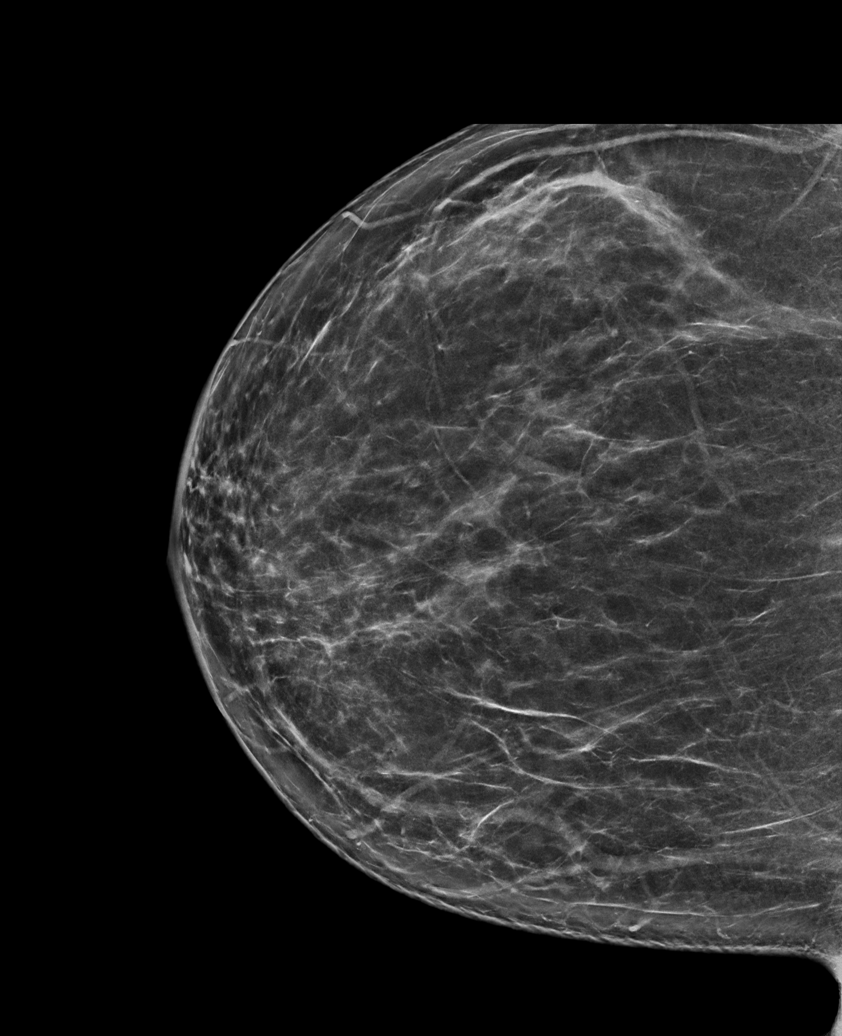

[L CC synth-2D]
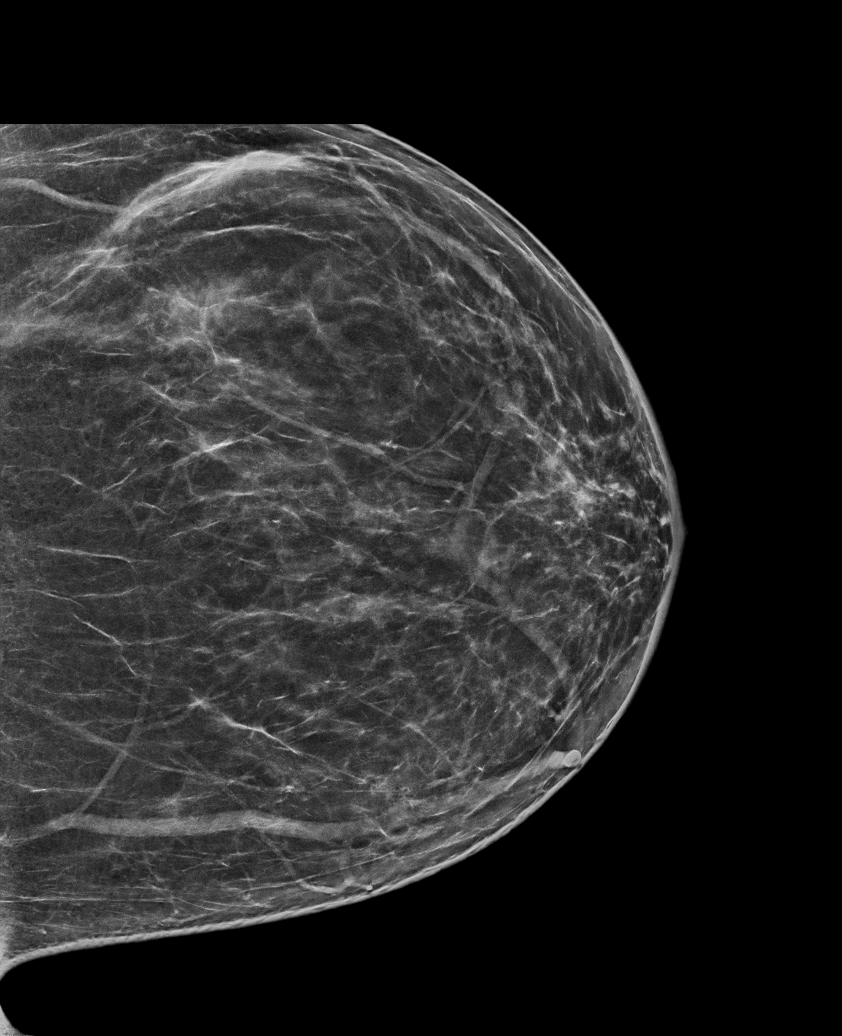

[L MLO synth-2D (1 of 2)]
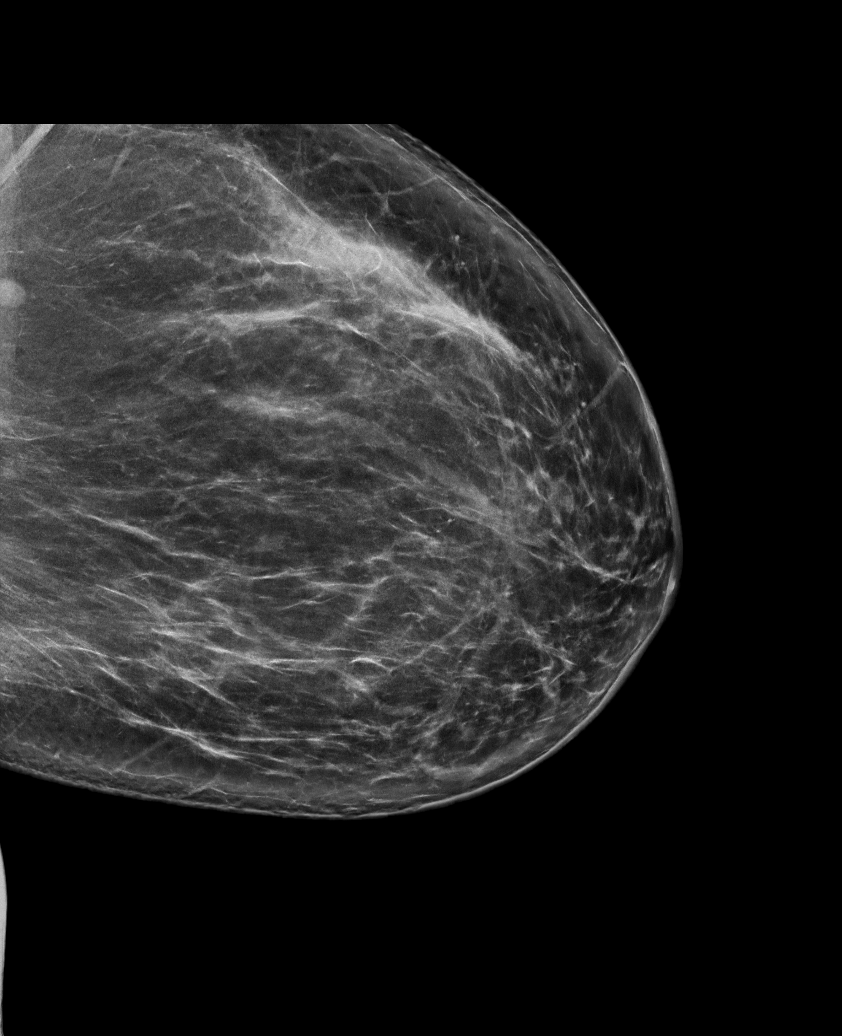

[L MLO synth-2D (2 of 2)]
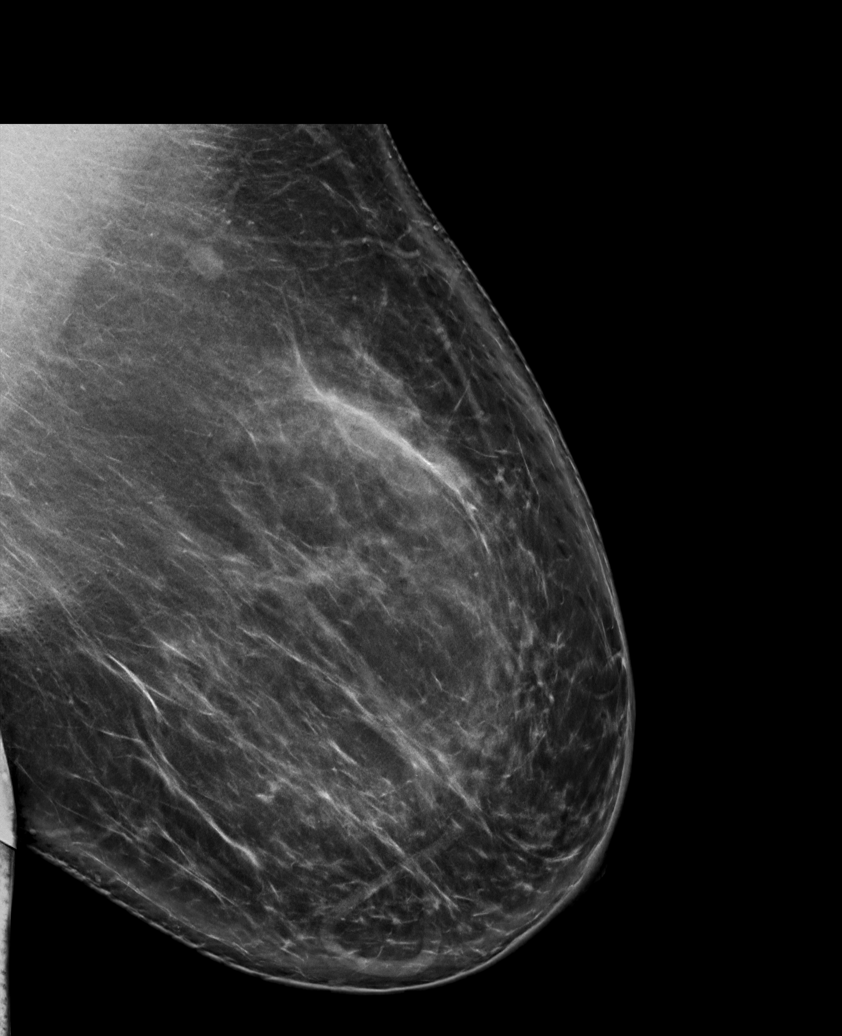

[6 of 36 positions shown; findings below may reference images not displayed]

ACR Breast Density Category b: There are scattered areas of
fibroglandular density.
FINDINGS: There are no findings suspicious for malignancy. The images were
evaluated with computer-aided detection.
IMPRESSION: No mammographic evidence of malignancy. A result letter of this
screening mammogram will be mailed directly to the patient.

RECOMMENDATION:
Screening mammogram in one year. (Code:ZP-7-VX7)

BI-RADS CATEGORY  1: Negative.

## 2022-03-12 ENCOUNTER — Ambulatory Visit
Admission: EM | Admit: 2022-03-12 | Discharge: 2022-03-12 | Disposition: A | Discharge status: Home / Self Care | Payer: 59

## 2022-03-12 DIAGNOSIS — J0100 Acute maxillary sinusitis, unspecified: Secondary | ICD-10-CM

## 2022-03-12 NOTE — ED Provider Notes (Signed)
[facility]    CSN: [id number] Arrival date & time: 03/12/22  [id number]      History   Chief Complaint Chief Complaint  Patient presents with   Nasal Congestion    HPI [name] is a 46 y.o. female.   Patient here for sinus pain x 4 days.  Admits nasal congestion, rhinorrhea, PND. She was taking OTC decongestant but felt it was causing her BP to rise, so she stopped.  She hasn't taken any medications today.    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   Avitaminosis D 03/04/2014   Back ache 02/07/2014   HLD (hyperlipidemia) 06/03/2011   Morbid obesity ([place]) 06/03/2011    Past Surgical History:  Procedure Laterality Date   ABDOMINAL HYSTERECTOMY     IR ANGIOGRAM SELECTIVE EACH ADDITIONAL VESSEL  01/26/2020   IR ANGIOGRAM SELECTIVE EACH ADDITIONAL VESSEL  01/26/2020   IR INFUSION THROMBOL ARTERIAL INITIAL (MS)  01/26/2020   IR INFUSION THROMBOL ARTERIAL INITIAL (MS)  01/26/2020   IR THROMB F/U EVAL ART/VEN FINAL DAY (MS)  01/27/2020   IR US GUIDE VASC ACCESS RIGHT  01/26/2020   LAPAROTOMY N/A 06/26/2015   Procedure: EXPLORATORY LAPAROTOMY, LYSIS OF ADHESIONS;  Surgeon: Armandina Gemma, MD;  Location: [facility] ORS;  Service: General;  Laterality: N/A;    OB History   No obstetric history on file.      Home Medications    Prior to Admission medications   Medication Sig Start Date End Date Taking? Authorizing Provider  citalopram (CELEXA) 10 MG tablet Take 10 mg by mouth daily.   Yes [provider]   metoprolol tartrate (LOPRESSOR) 25 MG tablet Take 25 mg by mouth 2 (two) times daily.   Yes [provider]  pravastatin (PRAVACHOL) 40 MG tablet Take 40 mg by mouth daily.   Yes [provider]  apixaban (ELIQUIS) 5 MG TABS tablet TAKE 1 TABLET (5 MG TOTAL) BY MOUTH TWO TIMES DAILY. 01/28/20 01/27/21  Jose Persia, MD  cyclobenzaprine (FLEXERIL) 10 MG tablet Take 10 mg by mouth 2 (two) times daily as needed. 10/31/19   [provider]  meloxicam (MOBIC) 7.5 MG tablet Take 7.5 mg by mouth daily. 11/10/19   [provider]  omeprazole (PRILOSEC) 20 MG capsule Take 1 capsule (20 mg total) by mouth daily. 02/22/15   Barrett, Lahoma Crocker, PA-C  oxyCODONE-acetaminophen (PERCOCET/ROXICET) 5-325 MG tablet Take 1-2 tablets by mouth every 4 (four) hours as needed for moderate pain. 07/03/15   RiebockEstill Bakes, NP    Family History Family History  Problem Relation Age of Onset   Breast cancer Neg Hx     Social History Social History   Tobacco Use   Smoking status: Never   Smokeless tobacco: Never  Vaping Use   Vaping Use: Never used  Substance Use Topics   Alcohol use: No   Drug use: No     Allergies   Patient has no known allergies.   Review of Systems Review of Systems  Constitutional:  Negative for chills, fatigue and fever.  HENT:  Positive for congestion, postnasal drip, rhinorrhea and sinus pain. Negative for ear pain, nosebleeds, sinus pressure and sore throat.   Eyes:  Negative for pain and redness.  Respiratory:  Negative for cough, shortness of breath and wheezing.   Gastrointestinal:  Negative for abdominal pain, diarrhea, nausea and vomiting.  Musculoskeletal:  Negative for arthralgias and myalgias.  Skin:  Negative for rash.  Neurological:  Negative for light-headedness and headaches.  Hematological:  Negative for adenopathy. Does not bruise/bleed easily.  Psychiatric/Behavioral:  Negative for confusion and sleep disturbance.       Physical Exam Triage Vital Signs ED Triage Vitals [03/12/22 0835]  Enc Vitals Group     BP 110/64     Pulse Rate 95     Resp 20     Temp 99.1 F (37.3 C)     Temp Source Oral     SpO2 96 %     Weight      Height      Head Circumference      Peak Flow      Pain Score 5     Pain Loc      Pain Edu?      Excl. in GC?    No data found.  Updated Vital Signs BP 110/64 (BP Location: Left Arm)   Pulse 95   Temp 99.1 F (37.3 C) (Oral)   Resp 20   SpO2 96%   Visual Acuity Right Eye Distance:   Left Eye Distance:   Bilateral Distance:    Right Eye Near:   Left Eye Near:    Bilateral Near:     Physical Exam Vitals and nursing note reviewed.  Constitutional:      General: She is not in acute distress.    Appearance: Normal appearance. She is well-developed. She is obese. She is not ill-appearing or toxic-appearing.  HENT:     Head: Normocephalic and atraumatic.     Right Ear: Tympanic membrane and ear canal normal. No swelling or tenderness. No middle ear effusion. Tympanic membrane is not injected, scarred, perforated, retracted or bulging.     Left Ear: Tympanic membrane and ear canal normal. No swelling or tenderness.  No middle ear effusion. Tympanic membrane is not injected, scarred, perforated, retracted or bulging.     Nose: Mucosal edema, congestion and rhinorrhea present. Rhinorrhea is clear.     Right Turbinates: Swollen. Not enlarged.     Left Turbinates: Swollen. Not enlarged.     Right Sinus: Frontal sinus tenderness present. No maxillary sinus tenderness.     Left Sinus: Frontal sinus tenderness present. No maxillary sinus tenderness.     Mouth/Throat:     Mouth: Mucous membranes are moist.     Pharynx: Oropharynx is clear. Uvula midline. No pharyngeal swelling, oropharyngeal exudate, posterior oropharyngeal erythema or uvula swelling.     Tonsils: No tonsillar exudate or tonsillar abscesses. 0 on the right. 0 on the left.  Eyes:     General: No  scleral icterus.    Extraocular Movements: Extraocular movements intact.     Conjunctiva/sclera: Conjunctivae normal.  Cardiovascular:     Rate and Rhythm: Normal rate and regular rhythm.     Heart sounds: No murmur heard. Pulmonary:     Effort: Pulmonary effort is normal. No respiratory distress.     Breath sounds: Normal breath sounds. No wheezing, rhonchi or rales.  Musculoskeletal:        General: Normal  range of motion.     Cervical back: Normal range of motion and neck supple. No rigidity.  Lymphadenopathy:     Cervical: No cervical adenopathy.  Skin:    General: Skin is warm and dry.  Neurological:     General: No focal deficit present.     Mental Status: She is alert and oriented to person, place, and time.     Motor: No weakness.     Gait: Gait normal.  Psychiatric:        Mood and Affect: Mood normal.        Behavior: Behavior normal.      UC Treatments / Results  Labs (all labs ordered are listed, but only abnormal results are displayed) Labs Reviewed - No data to display  EKG   Radiology No results found.  Procedures Procedures (including critical care time)  Medications Ordered in UC Medications - No data to display  Initial Impression / Assessment and Plan / UC Course  I have reviewed the triage vital signs and the nursing notes.  Pertinent labs & imaging results that were available during my care of the patient were reviewed by me and considered in my medical decision making (see chart for details).     Take allergy medication such as zyrtec  Take Coricidin as nasal decongestion  Use nasal saline rinses, or neti pot - boil water or use distilled water if you use a neti pot  Return 1 week, PRN if no improvement or worsening symptoms Final Clinical Impressions(s) / UC Diagnoses   Final diagnoses:  Acute non-recurrent maxillary sinusitis     Discharge Instructions      Take allergy medication such as zyrtec  Take Coricidin as nasal  decongestion  Use nasal saline rinses, or neti pot - boil water or use distilled water if you use a neti pot   ED Prescriptions   None    PDMP not reviewed this encounter.   Peri Jefferson, PA-C 03/12/22 [phone]

## 2022-03-12 NOTE — ED Triage Notes (Signed)
Pt c/o nasal congestion, sinus pain/pressure x 4 days-states she took OTC med and felt it increased her BP so she stopped-NAD-steady gait

## 2022-03-12 NOTE — Discharge Instructions (Signed)
Take allergy medication such as zyrtec  Take Coricidin as nasal decongestion  Use nasal saline rinses, or neti pot - boil water or use distilled water if you use a neti pot

## 2022-03-22 ENCOUNTER — Other Ambulatory Visit: Payer: Self-pay | Admitting: Family Medicine

## 2022-03-22 DIAGNOSIS — Z1231 Encounter for screening mammogram for malignant neoplasm of breast: Secondary | ICD-10-CM

## 2022-04-01 ENCOUNTER — Ambulatory Visit (INDEPENDENT_AMBULATORY_CARE_PROVIDER_SITE_OTHER): Payer: 59 | Admitting: Podiatry

## 2022-04-01 DIAGNOSIS — L600 Ingrowing nail: Secondary | ICD-10-CM

## 2022-04-01 DIAGNOSIS — M21619 Bunion of unspecified foot: Secondary | ICD-10-CM

## 2022-04-01 NOTE — Progress Notes (Signed)
Subjective:   Patient ID: [name], female   DOB: 46 y.o.   MRN: [id number]   HPI Patient presents with an incurvated left big toenail medial border painful when pressed making it hard for her to wear shoe gear without difficulty.  States that it has become more of an issue over this time and she may have traumatized her nail.  Patient does not smoke likes to be active if possible   Review of Systems  All other systems reviewed and are negative.       Objective:  Physical Exam Vitals and nursing note reviewed.  Constitutional:      Appearance: She is well-developed.  Pulmonary:     Effort: Pulmonary effort is normal.  Musculoskeletal:        General: Normal range of motion.  Skin:    General: Skin is warm.  Neurological:     Mental Status: She is alert.     Neurovascular status intact muscle strength found to be adequate range of motion adequate with incurvated left hallux medial border that is painful when pressed and making shoe gear difficult.  Patient has good digital perfusion well oriented no erythema edema or drainage associated with condition     Assessment:  Grown toenail deformity left hallux medial border with pain along with mild bunion nonsymptomatic     Plan:  H&P educated on both conditions and were focusing on the nail today.  I did explain procedure risk and patient wants to have this fixed and signed consent form after review.  I infiltrated the left big toe 60 mg like Marcaine mixture sterile prep done and using sterile instrumentation remove the medial border exposed matrix applied phenol 3 applications 30 seconds followed by alcohol lavage sterile dressing gave instructions on soaks and to wear dressing 24 hours but take it off earlier if throbbing were to occur encouraging her to call with questions concerns which may arise

## 2022-04-01 NOTE — Patient Instructions (Signed)

## 2022-05-13 ENCOUNTER — Ambulatory Visit
Admission: RE | Admit: 2022-05-13 | Discharge: 2022-05-13 | Disposition: A | Discharge status: Home / Self Care | Payer: 59 | Source: Ambulatory Visit | Attending: Family Medicine | Admitting: Family Medicine

## 2022-05-13 DIAGNOSIS — Z1231 Encounter for screening mammogram for malignant neoplasm of breast: Secondary | ICD-10-CM

## 2023-03-30 ENCOUNTER — Other Ambulatory Visit: Payer: Self-pay | Admitting: Family Medicine

## 2023-03-30 DIAGNOSIS — Z1231 Encounter for screening mammogram for malignant neoplasm of breast: Secondary | ICD-10-CM

## 2023-05-15 ENCOUNTER — Ambulatory Visit
Admission: RE | Admit: 2023-05-15 | Discharge: 2023-05-15 | Disposition: A | Discharge status: Home / Self Care | Payer: 59 | Source: Ambulatory Visit | Attending: Family Medicine | Admitting: Family Medicine

## 2023-05-15 DIAGNOSIS — Z1231 Encounter for screening mammogram for malignant neoplasm of breast: Secondary | ICD-10-CM

## 2024-04-10 ENCOUNTER — Other Ambulatory Visit: Payer: Self-pay | Admitting: Family Medicine

## 2024-04-10 DIAGNOSIS — Z1231 Encounter for screening mammogram for malignant neoplasm of breast: Secondary | ICD-10-CM

## 2024-05-16 ENCOUNTER — Ambulatory Visit
